# Patient Record
Sex: Male | Born: 1975 | Race: White | Hispanic: No | Marital: Single | Smoking: Current every day smoker
Health system: Southern US, Community
[De-identification: ages and names within clinical notes are randomized; demographics above are authoritative.]

## PROBLEM LIST (undated history)

## (undated) DIAGNOSIS — I10 Essential (primary) hypertension: Secondary | ICD-10-CM

## (undated) DIAGNOSIS — M1712 Unilateral primary osteoarthritis, left knee: Secondary | ICD-10-CM

## (undated) DIAGNOSIS — M1711 Unilateral primary osteoarthritis, right knee: Secondary | ICD-10-CM

## (undated) DIAGNOSIS — I499 Cardiac arrhythmia, unspecified: Secondary | ICD-10-CM

## (undated) DIAGNOSIS — R000 Tachycardia, unspecified: Secondary | ICD-10-CM

## (undated) HISTORY — PX: MULTIPLE TOOTH EXTRACTIONS: SHX2053

## (undated) HISTORY — PX: ANKLE SURGERY: SHX546

---

## 2016-02-08 ENCOUNTER — Emergency Department (HOSPITAL_COMMUNITY)
Admission: EM | Admit: 2016-02-08 | Discharge: 2016-02-08 | Disposition: A | Discharge status: Home / Self Care | Payer: BLUE CROSS/BLUE SHIELD | Attending: Emergency Medicine | Admitting: Emergency Medicine

## 2016-02-08 ENCOUNTER — Encounter (HOSPITAL_COMMUNITY): Payer: Self-pay | Admitting: *Deleted

## 2016-02-08 DIAGNOSIS — I10 Essential (primary) hypertension: Secondary | ICD-10-CM | POA: Diagnosis not present

## 2016-02-08 DIAGNOSIS — F17200 Nicotine dependence, unspecified, uncomplicated: Secondary | ICD-10-CM | POA: Insufficient documentation

## 2016-02-08 DIAGNOSIS — R000 Tachycardia, unspecified: Secondary | ICD-10-CM | POA: Diagnosis present

## 2016-02-08 DIAGNOSIS — R002 Palpitations: Secondary | ICD-10-CM | POA: Insufficient documentation

## 2016-02-08 HISTORY — DX: Tachycardia, unspecified: R00.0

## 2016-02-08 HISTORY — DX: Essential (primary) hypertension: I10

## 2016-02-08 LAB — CBC
HCT: 48.4 % (ref 39.0–52.0)
Hemoglobin: 16.4 g/dL (ref 13.0–17.0)
MCH: 30.3 pg (ref 26.0–34.0)
MCHC: 33.9 g/dL (ref 30.0–36.0)
MCV: 89.5 fL (ref 78.0–100.0)
PLATELETS: 310 10*3/uL (ref 150–400)
RBC: 5.41 MIL/uL (ref 4.22–5.81)
RDW: 13.4 % (ref 11.5–15.5)
WBC: 8.6 10*3/uL (ref 4.0–10.5)

## 2016-02-08 LAB — BASIC METABOLIC PANEL
Anion gap: 10 (ref 5–15)
BUN: 9 mg/dL (ref 6–20)
CALCIUM: 9.3 mg/dL (ref 8.9–10.3)
CO2: 22 mmol/L (ref 22–32)
CREATININE: 0.89 mg/dL (ref 0.61–1.24)
Chloride: 106 mmol/L (ref 101–111)
Glucose, Bld: 102 mg/dL — ABNORMAL HIGH (ref 65–99)
Potassium: 4.3 mmol/L (ref 3.5–5.1)
SODIUM: 138 mmol/L (ref 135–145)

## 2016-02-08 LAB — I-STAT CHEM 8, ED
BUN: 11 mg/dL (ref 6–20)
CREATININE: 0.9 mg/dL (ref 0.61–1.24)
Calcium, Ion: 1.14 mmol/L (ref 1.12–1.23)
Chloride: 106 mmol/L (ref 101–111)
Glucose, Bld: 102 mg/dL — ABNORMAL HIGH (ref 65–99)
HEMATOCRIT: 50 % (ref 39.0–52.0)
HEMOGLOBIN: 17 g/dL (ref 13.0–17.0)
POTASSIUM: 4.4 mmol/L (ref 3.5–5.1)
Sodium: 141 mmol/L (ref 135–145)
TCO2: 23 mmol/L (ref 0–100)

## 2016-02-08 LAB — I-STAT TROPONIN, ED: TROPONIN I, POC: 0.01 ng/mL (ref 0.00–0.08)

## 2016-02-08 MED ORDER — METOPROLOL TARTRATE 25 MG PO TABS
25.0000 mg | ORAL_TABLET | Freq: Once | ORAL | Status: AC
  Administered 2016-02-08: 25 mg via ORAL
  Filled 2016-02-08: qty 1

## 2016-02-08 MED ORDER — SODIUM CHLORIDE 0.9 % IV BOLUS (SEPSIS)
1000.0000 mL | Freq: Once | INTRAVENOUS | Status: AC
  Administered 2016-02-08: 1000 mL via INTRAVENOUS

## 2016-02-08 NOTE — Discharge Instructions (Signed)

## 2016-02-08 NOTE — ED Provider Notes (Signed)
CSN: [id number]     Arrival date & time 02/08/16  9562 History   First MD Initiated Contact with Patient 02/08/16 [phone]     Chief Complaint  Patient presents with  . Tachycardia    Patient is a 40 y.o. male presenting with palpitations. The history is provided by the patient.  Palpitations Palpitations quality:  Regular Onset quality:  Sudden Timing:  Constant Progression:  Improving Chronicity:  New Relieved by: htn meds. Worsened by:  Nothing Associated symptoms: diaphoresis, nausea, shortness of breath and vomiting   Associated symptoms: no chest pain and no syncope   Patient reports he woke up this morning he felt his heart racing He denies CP He reports he had some associated shortness of breath No syncope He took a metoprolol but reports nausea/vomiting soon after taking this medication  He reports he had similar episode previously and seen in hospitals in [place] area.  He is unsure of any final diagnosis but he is on metoprolol and losartan for HTN No h/o cardioversion.  He does not recall any adenosine administration  Past Medical History  Diagnosis Date  . Tachycardia   . Hypertension    Past Surgical History  Procedure Laterality Date  . Ankle surgery     No family history on file. Social History  Substance Use Topics  . Smoking status: Current Every Day Smoker  . Smokeless tobacco: Never Used  . Alcohol Use: Yes    Review of Systems  Constitutional: Positive for diaphoresis.  Respiratory: Positive for shortness of breath.   Cardiovascular: Positive for palpitations. Negative for chest pain and syncope.  Gastrointestinal: Positive for nausea and vomiting.  Neurological: Negative for syncope.  All other systems reviewed and are negative.     Allergies  Codeine  Home Medications   Prior to Admission medications   Not on File   BP 126/98 mmHg  Pulse 118  Temp(Src) 98.2 F (36.8 C) (Oral)  Resp 24  Ht  (1.88 m)  Wt 145.151 kg   BMI 41.07 kg/m2  SpO2 100% Physical Exam CONSTITUTIONAL: Well developed/well nourished, diaphoretic HEAD: Normocephalic/atraumatic EYES: EOMI/PERRL ENMT: Mucous membranes moist NECK: supple no meningeal signs SPINE/BACK:entire spine nontender CV: tachycardic, no murmurs/rubs/gallops noted LUNGS: Lungs are clear to auscultation bilaterally, no apparent distress ABDOMEN: soft, nontender NEURO: Pt is awake/alert/appropriate, moves all extremitiesx4.  No facial droop.   EXTREMITIES: pulses normal/equal, full ROM SKIN: warm, color normal PSYCH: no abnormalities of mood noted, alert and oriented to situation  ED Course  Procedures  7:14 AM Pt with onset of palpitations this morning, reportedly had heart rate in the 190s on arrival but when I Was in room it was 120s.  He may have had an episode of SVT that converted spontaneously He has no CP He is feeling improved Plan to hydrate, give dose of metoprolol and monitor in the ED Signed out to dr Cyndie Chime to f/u on labs and monitor rhythm  Labs Review Labs Reviewed  I-STAT CHEM 8, ED  Rosezena Sensor, ED      EKG Interpretation   Date/Time:  Thursday February 08 2016 06:45:50 EDT Ventricular Rate:  116 PR Interval:  165 QRS Duration: 95 QT Interval:  308 QTC Calculation: 428 R Axis:   88 Text Interpretation:  Sinus tachycardia Multiple premature complexes, vent  & supraven Low voltage, precordial leads No previous ECGs available  Abnormal ekg Confirmed by Bebe Shaggy  MD, Ladarrius Bogdanski ([id number]) on 02/08/2016  7:01:44 AM      MDM  Final diagnoses:  None    Nursing notes including past medical history and social history reviewed and considered in documentation     Zadie Rhineonald Tilly Pernice, MD 02/08/16 [phone]

## 2016-02-08 NOTE — ED Notes (Signed)
Patient presents stating he woke up with his heart racing.  Stated this has happened in the past.  Takes Lopressor and Lorsartin  States when he got here his heart was in the 190's

## 2016-02-08 NOTE — ED Provider Notes (Signed)
I accepted this patient in signout pending observation for 2 hours. After almost 3 hours of observation the patient did not have any recurrence of his symptoms or SVT. He appeared well. He had normal heart and lung examination during my evaluation. He was discharged home in stable condition per plan from prior physician.  Leta BaptistEmily Roe Nguyen, MD 02/08/16 [phone]

## 2016-02-13 ENCOUNTER — Ambulatory Visit (INDEPENDENT_AMBULATORY_CARE_PROVIDER_SITE_OTHER): Payer: BLUE CROSS/BLUE SHIELD | Admitting: Cardiovascular Disease

## 2016-02-13 ENCOUNTER — Encounter: Payer: Self-pay | Admitting: Cardiovascular Disease

## 2016-02-13 VITALS — BP 118/90 | HR 80 | Ht 74.0 in | Wt 337.2 lb

## 2016-02-13 DIAGNOSIS — R0600 Dyspnea, unspecified: Secondary | ICD-10-CM | POA: Diagnosis not present

## 2016-02-13 DIAGNOSIS — I10 Essential (primary) hypertension: Secondary | ICD-10-CM | POA: Diagnosis not present

## 2016-02-13 DIAGNOSIS — I471 Supraventricular tachycardia: Secondary | ICD-10-CM

## 2016-02-13 MED ORDER — METOPROLOL SUCCINATE ER 50 MG PO TB24: 50.0000 mg | ORAL_TABLET | Freq: Every day | ORAL | Status: AC

## 2016-02-13 NOTE — Patient Instructions (Signed)
Medication Instructions:  Your physician has recommended you make the following change in your medication:  1. INCREASE Metoprolol Succinate 50mg  take one tablet by mouth daily  Labwork: No new orders.   Testing/Procedures: Your physician has requested that you have an echocardiogram. Echocardiography is a painless test that uses sound waves to create images of your heart. It provides your doctor with information about the size and shape of your heart and how well your heart's chambers and valves are working. This procedure takes approximately one hour. There are no restrictions for this procedure.  Follow-Up: Your physician wants you to follow-up in: 6 MONTHS with Dr Kirke CorinArida.  You will receive a reminder letter in the mail two months in advance. If you don't receive a letter, please call our office to schedule the follow-up appointment.   Any Other Special Instructions Will Be Listed Below (If Applicable).     If you need a refill on your cardiac medications before your next appointment, please call your pharmacy.

## 2016-02-13 NOTE — Progress Notes (Signed)
Cardiology Office Note   Date:  02/13/2016   ID:  Tony Valdez, DOB May 08, 1976, MRN [id number]  PCP:  [facility]  Cardiologist:  New : Lorine Bears, MD   No chief complaint on file.     History of Present Illness: Tony Valdez is a 40 y.o. male who was referred from the emergency room for evaluation of tachycardia. He reports history of tachycardia that started in 2013. He was hospitalized in [place] at that time and according to him he underwent a stress test and echocardiogram. Both of them were unremarkable. He was discharged home on metoprolol extended release 25 mg once daily. He was on an losartan before that for essential hypertension. He had another episode of tachycardia 2016 after he stopped taking his medications. He went to the emergency room and his medications were refilled. He sometimes has short episodes of tachycardia that usually terminates with vagal maneuvers. He recently woke up at 4:00 in the morning and noted significant tachycardia that did not respond to bearing down. The episode lasted for a few hours and thus he went to the emergency room. By the time he arrived, he was noted to be in sinus tachycardia with a heart rate of around 120 bpm. EKG showed frequent PACs. His labs were overall unremarkable. He complains of chronic exertional dyspnea with no recent worsening. He denies any chest pain. He is not aware of history of sleep apnea. He sleeps by himself and is not aware of apnea episodes. He does snore but has no other symptoms suggestive of sleep apnea. Other medical problems include obesity and tobacco use. He smokes 2 packs per day since he was 40 years old. He does have family history of premature coronary artery disease as his father had myocardial infarction twice in his 56s. He works as a Geophysical data processor.    Past Medical History  Diagnosis Date  . Tachycardia   . Hypertension     Past Surgical History    Procedure Laterality Date  . Ankle surgery       Current Outpatient Prescriptions  Medication Sig Dispense Refill  . losartan (COZAAR) 100 MG tablet Take 100 mg by mouth daily.    . metoprolol succinate (TOPROL-XL) 25 MG 24 hr tablet Take 25 mg by mouth daily.     No current facility-administered medications for this visit.    Allergies:   Codeine    Social History:  The patient  reports that he has been smoking.  He has never used smokeless tobacco. He reports that he drinks alcohol. He reports that he does not use illicit drugs.   Family History:  The patient's family history includes Cancer in his mother; Heart disease in his father; Hypertension in his father.    ROS:  Please see the history of present illness.   Otherwise, review of systems are positive for none.   All other systems are reviewed and negative.    PHYSICAL EXAM: VS:  BP 118/90 mmHg  Pulse 80  Ht  (1.88 m)  Wt 337 lb 4 oz (152.976 kg)  BMI 43.28 kg/m2  SpO2 97% , BMI Body mass index is 43.28 kg/(m^2). GEN: Well nourished, well developed, in no acute distress HEENT: normal Neck: no JVD, carotid bruits, or masses Cardiac: RRR; no murmurs, rubs, or gallops,no edema  Respiratory:  clear to auscultation bilaterally, normal work of breathing GI: soft, nontender, nondistended, + BS MS: no deformity or atrophy Skin: warm and dry,  no rash Neuro:  Strength and sensation are intact Psych: euthymic mood, full affect   EKG:  EKG is not ordered today. Recent EKGs were reviewed and showed sinus tachycardia with PACs.   Recent Labs: 02/08/2016: BUN 11; Creatinine, Ser 0.90; Hemoglobin 17.0; Platelets 310; Potassium 4.4; Sodium 141    Lipid Panel No results found for: CHOL, TRIG, HDL, CHOLHDL, VLDL, LDLCALC, LDLDIRECT    Wt Readings from Last 3 Encounters:  02/13/16 337 lb 4 oz (152.976 kg)  02/08/16 320 lb (145.151 kg)        ASSESSMENT AND PLAN:  1.  Paroxysmal supraventricular tachycardia:  The patient's history is highly suggestive of this. His episodes are not very frequent and overall he had 4 episodes since 2013. He is aware of vagal maneuvers. I elected to increase the dose of Toprol to 50 mg once daily. I requested an echocardiogram given his associated shortness of breath showed no structural heart disease. If the episodes become more frequent, catheter ablation can be considered.  2. Essential hypertension: Blood pressure is reasonably controlled on medications.  3. Tobacco use: I discussed with him the importance of smoking cessation. He actually does have a prescription for Chantix but he has not started using.  4. Obesity: I discussed the importance of weight loss. He has no convincing symptoms of sleep apnea. However, given his hypertension and obesity, a sleep study should be considered.    Disposition:   FU with me in 6 months  Signed,  Lorine BearsMuhammad Arida, MD  02/13/2016 8:56 AM    [facility]

## 2016-02-29 ENCOUNTER — Ambulatory Visit (HOSPITAL_COMMUNITY): Payer: BLUE CROSS/BLUE SHIELD | Attending: Internal Medicine

## 2016-02-29 ENCOUNTER — Other Ambulatory Visit: Payer: Self-pay

## 2016-02-29 DIAGNOSIS — I471 Supraventricular tachycardia, unspecified: Secondary | ICD-10-CM

## 2016-02-29 DIAGNOSIS — I119 Hypertensive heart disease without heart failure: Secondary | ICD-10-CM | POA: Diagnosis not present

## 2016-02-29 DIAGNOSIS — R0600 Dyspnea, unspecified: Secondary | ICD-10-CM

## 2016-02-29 LAB — ECHOCARDIOGRAM COMPLETE
AOASC: 34 cm
CHL CUP DOP CALC LVOT VTI: 23 cm
E decel time: 194 msec
E/e' ratio: 9.19
FS: 29 % (ref 28–44)
IV/PV OW: 1
LA diam index: 1.66 cm/m2
LA vol index: 14.8 mL/m2
LASIZE: 45 mm
LAVOL: 40 mL
LAVOLA4C: 38 mL
LDCA: 5.31 cm2
LEFT ATRIUM END SYS DIAM: 45 mm
LV E/e'average: 9.19
LV TDI E'LATERAL: 8.77
LV TDI E'MEDIAL: 8.15
LVEEMED: 9.19
LVELAT: 8.77 cm/s
LVOT peak grad rest: 4 mmHg
LVOTD: 26 mm
LVOTPV: 99.4 cm/s
LVOTSV: 122 mL
MV Dec: 194
MV pk E vel: 80.6 m/s
MVPG: 3 mmHg
MVPKAVEL: 51.8 m/s
PW: 10.7 mm — AB (ref 0.6–1.1)

## 2016-02-29 MED ORDER — PERFLUTREN LIPID MICROSPHERE
1.0000 mL | INTRAVENOUS | Status: AC | PRN
  Administered 2016-02-29: 2 mL via INTRAVENOUS

## 2016-06-09 ENCOUNTER — Encounter (HOSPITAL_COMMUNITY): Payer: Self-pay

## 2016-06-09 ENCOUNTER — Emergency Department (HOSPITAL_COMMUNITY)
Admission: EM | Admit: 2016-06-09 | Discharge: 2016-06-09 | Disposition: A | Discharge status: Home / Self Care | Payer: BLUE CROSS/BLUE SHIELD | Attending: Emergency Medicine | Admitting: Emergency Medicine

## 2016-06-09 DIAGNOSIS — I10 Essential (primary) hypertension: Secondary | ICD-10-CM | POA: Insufficient documentation

## 2016-06-09 DIAGNOSIS — Y939 Activity, unspecified: Secondary | ICD-10-CM | POA: Insufficient documentation

## 2016-06-09 DIAGNOSIS — S161XXA Strain of muscle, fascia and tendon at neck level, initial encounter: Secondary | ICD-10-CM | POA: Insufficient documentation

## 2016-06-09 DIAGNOSIS — Y92410 Unspecified street and highway as the place of occurrence of the external cause: Secondary | ICD-10-CM | POA: Insufficient documentation

## 2016-06-09 DIAGNOSIS — F17200 Nicotine dependence, unspecified, uncomplicated: Secondary | ICD-10-CM | POA: Insufficient documentation

## 2016-06-09 DIAGNOSIS — Y999 Unspecified external cause status: Secondary | ICD-10-CM | POA: Diagnosis not present

## 2016-06-09 DIAGNOSIS — S199XXA Unspecified injury of neck, initial encounter: Secondary | ICD-10-CM | POA: Diagnosis present

## 2016-06-09 MED ORDER — NAPROXEN 500 MG PO TABS: 500.0000 mg | ORAL_TABLET | Freq: Two times a day (BID) | ORAL | 0 refills | Status: DC

## 2016-06-09 MED ORDER — DIAZEPAM 5 MG PO TABS: 5.0000 mg | ORAL_TABLET | Freq: Two times a day (BID) | ORAL | 0 refills | Status: DC

## 2016-06-09 NOTE — Discharge Instructions (Signed)
Take your medication as prescribed as an for pain relief. I recommend applying heat to affected area for 15 minutes 3-4 times daily for additional pain relief. Follow up with your primary care provider in the next week if her symptoms have not improved. Please return to the Emergency Department if symptoms worsen or new onset of fever, headache, neck stiffness, back pain, chest pain, difficulty breathing, abdominal pain, numbness, tailing, weakness.
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
## 2016-06-09 NOTE — ED Notes (Signed)
Declined W/C at D/C and was escorted to lobby by RN. 
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
## 2016-11-05 ENCOUNTER — Ambulatory Visit (INDEPENDENT_AMBULATORY_CARE_PROVIDER_SITE_OTHER): Payer: BLUE CROSS/BLUE SHIELD | Admitting: Cardiovascular Disease

## 2016-11-05 ENCOUNTER — Encounter: Payer: Self-pay | Admitting: Cardiovascular Disease

## 2016-11-05 VITALS — BP 118/78 | HR 77 | Ht 74.0 in | Wt 298.0 lb

## 2016-11-05 DIAGNOSIS — I471 Supraventricular tachycardia: Secondary | ICD-10-CM

## 2016-11-05 DIAGNOSIS — I10 Essential (primary) hypertension: Secondary | ICD-10-CM

## 2016-11-05 DIAGNOSIS — Z720 Tobacco use: Secondary | ICD-10-CM | POA: Diagnosis not present

## 2016-11-05 NOTE — Patient Instructions (Signed)

## 2016-11-05 NOTE — Progress Notes (Signed)
Cardiology Office Note   Date:  11/05/2016   ID:  Tony Valdez, DOB 08-07-1976, MRN [id number]  PCP:  [facility]  Cardiologist:   Lorine Bears, MD   Chief Complaint  Patient presents with  . Follow-up      History of Present Illness: Tony Valdez is a 41 y.o. male who is here today for a follow-up visit regarding paroxysmal supraventricular tachycardia.  He had 4 episodes of tachycardia since 2013 and none in the last year. These were suspected to be supraventricular tachycardia. He had episodes in the past that responded to vagal maneuvers. Other medical problems include obesity, essential hypertension and tobacco use.  He works as a Geophysical data processor. Echocardiogram in July 2017 showed normal LV systolic function with no significant valvular abnormalities. He has been doing well with no recurrent palpitations or tachycardia. No chest pain or shortness of breath. He lost 38 pounds over the last year.   Past Medical History:  Diagnosis Date  . Hypertension   . Tachycardia     Past Surgical History:  Procedure Laterality Date  . ANKLE SURGERY       Current Outpatient Prescriptions  Medication Sig Dispense Refill  . losartan (COZAAR) 100 MG tablet Take 100 mg by mouth daily.    . metoprolol succinate (TOPROL-XL) 50 MG 24 hr tablet Take 1 tablet (50 mg total) by mouth daily. 90 tablet 3   No current facility-administered medications for this visit.     Allergies:   Codeine    Social History:  The patient  reports that he has been smoking.  He has been smoking about 1.00 pack per day. He has never used smokeless tobacco. He reports that he drinks alcohol. He reports that he does not use drugs.   Family History:  The patient's family history includes Cancer in his mother; Heart disease in his father; Hypertension in his father.    ROS:  Please see the history of present illness.   Otherwise, review of systems are positive for  none.   All other systems are reviewed and negative.    PHYSICAL EXAM: VS:  BP 118/78   Pulse 77   Ht 6\' 2"  (1.88 m)   Wt 298 lb (135.2 kg)   BMI 38.26 kg/m  , BMI Body mass index is 38.26 kg/m. GEN: Well nourished, well developed, in no acute distress  HEENT: normal  Neck: no JVD, carotid bruits, or masses Cardiac: RRR; no murmurs, rubs, or gallops,no edema  Respiratory:  clear to auscultation bilaterally, normal work of breathing GI: soft, nontender, nondistended, + BS MS: no deformity or atrophy  Skin: warm and dry, no rash Neuro:  Strength and sensation are intact Psych: euthymic mood, full affect   EKG:  EKG is ordered today. Normal sinus rhythm with significant ST or T wave changes.   Recent Labs: 02/08/2016: BUN 11; Creatinine, Ser 0.90; Hemoglobin 17.0; Platelets 310; Potassium 4.4; Sodium 141    Lipid Panel No results found for: CHOL, TRIG, HDL, CHOLHDL, VLDL, LDLCALC, LDLDIRECT    Wt Readings from Last 3 Encounters:  11/05/16 298 lb (135.2 kg)  06/09/16 (!) 315 lb (142.9 kg)  02/13/16 (!) 337 lb 4 oz (153 kg)        ASSESSMENT AND PLAN:  1.  Paroxysmal supraventricular tachycardia:  No tachycardic episodes over the last year. Continue treatment with Toprol.  2. Essential hypertension: Blood pressure is well controlled on losartan and Toprol. If blood pressure goes  down with weight loss, the dose of losartan can be decreased.  3. Tobacco use:  I again discussed with him the importance of smoking cessation but he has not been able to quit.  4. Obesity: I congratulated him on weight loss. He is more active than before and trying to pay more attention to his diet.  Disposition:   FU with me in 12 months  Signed,  Lorine BearsMuhammad Arida, MD  11/05/2016 10:25 AM    [facility]

## 2017-12-04 ENCOUNTER — Telehealth: Payer: Self-pay | Admitting: *Deleted

## 2017-12-04 NOTE — Telephone Encounter (Signed)
Needs appt, has not been seen for 1 year.     Primary Cardiologist:Muhammad Kirke CorinArida, MD  Chart reviewed as part of pre-operative protocol coverage. Because of Tony Valdez's past medical history and time since last visit, he/she will require a follow-up visit in order to better assess preoperative cardiovascular risk.  Pre-op covering staff: - Please schedule appointment and call patient to inform them. - Please contact requesting surgeon's office via preferred method (i.e, phone, fax) to inform them of need for appointment prior to surgery.  Nada BoozerLaura Ingold, NP  12/04/2017, 4:06 PM

## 2017-12-04 NOTE — Telephone Encounter (Signed)
   [facility] Pre-operative Risk Assessment    Request for surgical clearance:  1. What type of surgery is being performed? Right total Knee Replacement   2. When is this surgery scheduled? 12/30/17   3. What type of clearance is required (medical clearance vs. Pharmacy clearance to hold med vs. Both)? medical  4. Are there any medications that need to be held prior to surgery and how long?   5. Practice name and name of physician performing surgery? [facility] Dr. Mardelle Matte   6. What is your office phone number [phone]    7.   What is your office fax number [phone] attn: Sherri  8.   Anesthesia type (None, local, MAC, general) ?    Tony Valdez 12/04/2017, 11:45 AM  _________________________________________________________________   (provider comments below)

## 2017-12-04 NOTE — Telephone Encounter (Signed)
Pt notified that he needs to be seen prior to his cardiac clearance. Pt has been scheduled to see Azalee CourseHao Meng, PA-C 12/18/17 @ 11:00. Soonest thing before pts surgery. Pt thanked me for the call.

## 2017-12-04 NOTE — Telephone Encounter (Signed)
-----   Message from Leone BrandLaura R Ingold, NP sent at 12/04/2017  4:09 PM EDT ----- Please arrange appt.  Prior to 12/30/17.

## 2017-12-17 ENCOUNTER — Other Ambulatory Visit: Payer: Self-pay | Admitting: Orthopedic Surgery

## 2017-12-18 ENCOUNTER — Ambulatory Visit (INDEPENDENT_AMBULATORY_CARE_PROVIDER_SITE_OTHER): Payer: BLUE CROSS/BLUE SHIELD | Admitting: Physician Assistant

## 2017-12-18 ENCOUNTER — Encounter: Payer: Self-pay | Admitting: Physician Assistant

## 2017-12-18 VITALS — BP 138/92 | HR 56 | Ht 74.0 in | Wt 322.8 lb

## 2017-12-18 DIAGNOSIS — I10 Essential (primary) hypertension: Secondary | ICD-10-CM | POA: Diagnosis not present

## 2017-12-18 DIAGNOSIS — I471 Supraventricular tachycardia: Secondary | ICD-10-CM | POA: Diagnosis not present

## 2017-12-18 DIAGNOSIS — Z01810 Encounter for preprocedural cardiovascular examination: Secondary | ICD-10-CM | POA: Diagnosis not present

## 2017-12-18 NOTE — Patient Instructions (Signed)
Medication Instructions:  Continue current medications.  If you need a refill on your cardiac medications before your next appointment, please call your pharmacy.  Labwork: None ordered  Testing/Procedures: None Ordered  Follow-Up: Your physician wants you to follow-up in: One year with Dr.Arida. You should receive a reminder letter in the mail two months in advance. If you do not receive a letter, please call our office [phone].   Thank you for choosing [facility] at [facility]!!

## 2017-12-18 NOTE — Pre-Procedure Instructions (Signed)
Juliette MangleDamon Fickling  12/18/2017      [name] - [place], [place] - [address] [place] [id number] Phone: [phone] Fax: [phone]    Your procedure is scheduled on May 7  [facility] Admitting at 0900 A.M.  Call this number if you have problems the morning of surgery:  [phone]   Remember:  Do not eat food or drink liquids after midnight.  Continue all medications as directed by your physician except follow these medication instructions before surgery below   Take these medicines the morning of surgery with A SIP OF WATER  buPROPion (WELLBUTRIN SR) metoprolol succinate (TOPROL-XL)  7 days prior to surgery STOP taking any Aspirin(unless otherwise instructed by your surgeon), Aleve, Naproxen, Ibuprofen, Motrin, Advil, Goody's, BC's, all herbal medications, fish oil, and all vitamins    Do not wear jewelry  Do not wear lotions, powders, or cologne, or deodorant.  Men may shave face and neck.  Do not bring valuables to the hospital.  [facility] is not responsible for any belongings or valuables.  Contacts, dentures or bridgework may not be worn into surgery.  Leave your suitcase in the car.  After surgery it may be brought to your room.  For patients admitted to the hospital, discharge time will be determined by your treatment team.  Patients discharged the day of surgery will not be allowed to drive home.    Special instructions:   [facility]- Preparing For Surgery  Before surgery, you can play an important role. Because skin is not sterile, your skin needs to be as free of germs as possible. You can reduce the number of germs on your skin by washing with CHG (chlorahexidine gluconate) Soap before surgery.  CHG is an antiseptic cleaner which kills germs and bonds with the skin to continue killing germs even after washing.  Please do not use if you have an allergy to CHG or antibacterial soaps. If your skin  becomes reddened/irritated stop using the CHG.  Do not shave (including legs and underarms) for at least 48 hours prior to first CHG shower. It is OK to shave your face.  Please follow these instructions carefully.   1. Shower the NIGHT BEFORE SURGERY and the MORNING OF SURGERY with CHG.   2. If you chose to wash your hair, wash your hair first as usual with your normal shampoo.  3. After you shampoo, rinse your hair and body thoroughly to remove the shampoo.  4. Use CHG as you would any other liquid soap. You can apply CHG directly to the skin and wash gently with a scrungie or a clean washcloth.   5. Apply the CHG Soap to your body ONLY FROM THE NECK DOWN.  Do not use on open wounds or open sores. Avoid contact with your eyes, ears, mouth and genitals (private parts). Wash Face and genitals (private parts)  with your normal soap.  6. Wash thoroughly, paying special attention to the area where your surgery will be performed.  7. Thoroughly rinse your body with warm water from the neck down.  8. DO NOT shower/wash with your normal soap after using and rinsing off the CHG Soap.  9. Pat yourself dry with a CLEAN TOWEL.  10. Wear CLEAN PAJAMAS to bed the night before surgery, wear comfortable clothes the morning of surgery  11. Place CLEAN SHEETS on your bed the night of your first shower and DO NOT  SLEEP WITH PETS.    Day of Surgery: Do not apply any deodorants/lotions. Please wear clean clothes to the hospital/surgery center.      Please read over the following fact sheets that you were given.

## 2017-12-18 NOTE — Progress Notes (Signed)
Cardiology Office Note    Date:  12/20/2017   ID:  Tony Valdez, DOB 29-Mar-1976, MRN [id number]  PCP:  [facility]  Cardiologist:  Dr. Kirke Corin  Chief Complaint  Patient presents with  . Follow-up    pt denies chest pain, SOB, swelling in hands or feet  . Pre-op Exam    surg clearance, requested by Dr. Teryl Lucy prior to knee surgery    History of Present Illness:  Tony Valdez is a 42 y.o. male with past medical history of paroxysmal supraventricular tachycardia and hypertension.  He had 4 episodes of tachycardia since 2013.  Her last office visit with Dr. Kirke Corin was in March 2018.  He was doing well at the time. His previous episodes have responded to vagal maneuvers.  Otherwise he does not have any prior history of coronary artery disease.  Last echocardiogram obtained on 02/29/2016 showed EF 55 to 60%, grade 1 DD, no significant valvular issue.  Patient presents today for annual visit and preoperative clearance prior to bilateral knee surgery by Dr. Teryl Lucy.  He will have his right knee done in May and potentially left knee done July.  He denies any recent chest pain, shortness of breath, or dizziness.  He has been tolerating the current blood pressure medication okay.  He does not check his blood pressure at home.  He has no problems climbing up 2 flight of stairs or walk 4 blocks away from his house and back.  Since he is able to complete at least 4 METs of activity, he is cleared to proceed with surgery.  In the past year he has not had any palpitation.  If in the post surgical recovery he has any supraventricular tachycardia, he can take extra dose of metoprolol for suppression.   Past Medical History:  Diagnosis Date  . Hypertension   . Tachycardia     Past Surgical History:  Procedure Laterality Date  . ANKLE SURGERY Left   . MULTIPLE TOOTH EXTRACTIONS      Current Medications: Outpatient Medications Prior to Visit  Medication Sig  Dispense Refill  . buPROPion (WELLBUTRIN SR) 150 MG 12 hr tablet Take 300 mg by mouth daily.    Marland Kitchen ibuprofen (ADVIL,MOTRIN) 200 MG tablet Take 400-600 mg by mouth daily as needed for headache or moderate pain.    Marland Kitchen losartan (COZAAR) 100 MG tablet Take 100 mg by mouth daily.    . metoprolol succinate (TOPROL-XL) 50 MG 24 hr tablet Take 1 tablet (50 mg total) by mouth daily. 90 tablet 3  . nicotine (NICODERM CQ - DOSED IN MG/24 HOURS) 21 mg/24hr patch Place 21 mg onto the skin daily.     No facility-administered medications prior to visit.      Allergies:   Codeine   Social History   Socioeconomic History  . Marital status: Single    Spouse name: Not on file  . Number of children: Not on file  . Years of education: Not on file  . Highest education level: Not on file  Occupational History  . Not on file  Social Needs  . Financial resource strain: Not on file  . Food insecurity:    Worry: Not on file    Inability: Not on file  . Transportation needs:    Medical: Not on file    Non-medical: Not on file  Tobacco Use  . Smoking status: Current Every Day Smoker    Packs/day: 1.50  . Smokeless tobacco: Never  Used  . Tobacco comment: work to quit currently  Substance and Sexual Activity  . Alcohol use: Yes    Comment: occ  . Drug use: No  . Sexual activity: Not on file  Lifestyle  . Physical activity:    Days per week: Not on file    Minutes per session: Not on file  . Stress: Not on file  Relationships  . Social connections:    Talks on phone: Not on file    Gets together: Not on file    Attends religious service: Not on file    Active member of club or organization: Not on file    Attends meetings of clubs or organizations: Not on file    Relationship status: Not on file  Other Topics Concern  . Not on file  Social History Narrative  . Not on file     Family History:  The patient's family history includes Cancer in his mother; Heart disease in his father;  Hypertension in his father.   ROS:   Please see the history of present illness.    ROS All other systems reviewed and are negative.   PHYSICAL EXAM:   VS:  BP (!) 138/92 (BP Location: Left Arm)   Pulse (!) 56   Ht 6\' 2"  (1.88 m)   Wt (!) 322 lb 12.8 oz (146.4 kg)   BMI 41.45 kg/m    GEN: Well nourished, well developed, in no acute distress  HEENT: normal  Neck: no JVD, carotid bruits, or masses Cardiac: RRR; no murmurs, rubs, or gallops,no edema  Respiratory:  clear to auscultation bilaterally, normal work of breathing GI: soft, nontender, nondistended, + BS MS: no deformity or atrophy  Skin: warm and dry, no rash Neuro:  Alert and Oriented x 3, Strength and sensation are intact Psych: euthymic mood, full affect  Wt Readings from Last 3 Encounters:  12/19/17 (!) 323 lb 11.2 oz (146.8 kg)  12/18/17 (!) 322 lb 12.8 oz (146.4 kg)  11/05/16 298 lb (135.2 kg)      Studies/Labs Reviewed:   EKG:  EKG is ordered today.  The ekg ordered today demonstrates sinus bradycardia, heart rate 56, otherwise no significant ST-T wave changes  Recent Labs: 12/19/2017: BUN 12; Creatinine, Ser 0.84; Hemoglobin 16.1; Platelets 275; Potassium 4.3; Sodium 138   Lipid Panel No results found for: CHOL, TRIG, HDL, CHOLHDL, VLDL, LDLCALC, LDLDIRECT  Additional studies/ records that were reviewed today include:   Echo 02/29/2016 LV EF: 55% -   60% Study Conclusions  - Procedure narrative: Transthoracic echocardiography for left   ventricular function evaluation, for right ventricular function   evaluation, and for assessment of valvular function. Image   quality was suboptimal. Intravenous contrast (Definity) was   administered to enhance regional wall motion assessment and   opacify the LV. - Left ventricle: The cavity size was normal. Wall thickness was   normal. Systolic function was normal. The estimated ejection   fraction was in the range of 55% to 60%. Wall motion was normal;   there  were no regional wall motion abnormalities. Doppler   parameters are consistent with abnormal left ventricular   relaxation (grade 1 diastolic dysfunction). The E/e&' ratio is   between 8-15, suggesting indeterminate LV filling pressure. - Left atrium: The atrium was normal in size.  Impressions:  - Technically difficult study. Definity contrast given. LVEF   55-60%, normal wall thickness, grossly normal wall motion,   diastolic dysfunction, indeterminate LV filling pressure, normal  LA size.    ASSESSMENT:    1. Preop cardiovascular exam   2. PSVT (paroxysmal supraventricular tachycardia) (HCC)   3. Essential hypertension      PLAN:  In order of problems listed above:  1. Preoperative clearance: Upcoming knee surgery by Dr. Teryl Lucy.  Patient is able to complete at least 4 METS of activity without any exertional chest pain or shortness of breath.  He does not have any prior history of CAD.  He may proceed with surgery without further work-up.    2. PSVT: Well-controlled on metoprolol, he has not had any recurrence in the past year.  He may take additional metoprolol on an as-needed basis if he does have recurrence.  3. Hypertension: Blood pressure mildly elevated, will need to monitor at home, if continue to be elevated on follow-up, will uptitrate blood pressure medication.    Medication Adjustments/Labs and Tests Ordered: Current medicines are reviewed at length with the patient today.  Concerns regarding medicines are outlined above.  Medication changes, Labs and Tests ordered today are listed in the Patient Instructions below. Patient Instructions  Medication Instructions:  Continue current medications.  If you need a refill on your cardiac medications before your next appointment, please call your pharmacy.  Labwork: None ordered  Testing/Procedures: None Ordered  Follow-Up: Your physician wants you to follow-up in: One year with Dr.Arida. You should  receive a reminder letter in the mail two months in advance. If you do not receive a letter, please call our office [phone].   Thank you for choosing [facility] at [employer], [place], [place]  12/20/2017 8:35 PM    [facility] [address] [place], [place], [place]  [address] Phone: [phone]; Fax: [phone]

## 2017-12-19 ENCOUNTER — Encounter (HOSPITAL_COMMUNITY)
Admission: RE | Admit: 2017-12-19 | Discharge: 2017-12-19 | Disposition: A | Discharge status: Home / Self Care | Payer: BLUE CROSS/BLUE SHIELD | Source: Ambulatory Visit | Attending: Orthopedic Surgery | Admitting: Orthopedic Surgery

## 2017-12-19 ENCOUNTER — Encounter (HOSPITAL_COMMUNITY): Payer: Self-pay

## 2017-12-19 ENCOUNTER — Other Ambulatory Visit: Payer: Self-pay

## 2017-12-19 DIAGNOSIS — Z01812 Encounter for preprocedural laboratory examination: Secondary | ICD-10-CM | POA: Diagnosis not present

## 2017-12-19 LAB — BASIC METABOLIC PANEL
Anion gap: 10 (ref 5–15)
BUN: 12 mg/dL (ref 6–20)
CALCIUM: 9.3 mg/dL (ref 8.9–10.3)
CO2: 21 mmol/L — AB (ref 22–32)
CREATININE: 0.84 mg/dL (ref 0.61–1.24)
Chloride: 107 mmol/L (ref 101–111)
GFR calc Af Amer: >60 mL/min (ref >60)
GFR calc non Af Amer: >60 mL/min (ref >60)
GLUCOSE: 93 mg/dL (ref 65–99)
Potassium: 4.3 mmol/L (ref 3.5–5.1)
Sodium: 138 mmol/L (ref 135–145)

## 2017-12-19 LAB — CBC
HCT: 47.6 % (ref 39.0–52.0)
HEMOGLOBIN: 16.1 g/dL (ref 13.0–17.0)
MCH: 29.4 pg (ref 26.0–34.0)
MCHC: 33.8 g/dL (ref 30.0–36.0)
MCV: 87 fL (ref 78.0–100.0)
PLATELETS: 275 10*3/uL (ref 150–400)
RBC: 5.47 MIL/uL (ref 4.22–5.81)
RDW: 13.4 % (ref 11.5–15.5)
WBC: 7.6 10*3/uL (ref 4.0–10.5)

## 2017-12-19 LAB — SURGICAL PCR SCREEN
MRSA, PCR: NEGATIVE
STAPHYLOCOCCUS AUREUS: NEGATIVE

## 2017-12-19 NOTE — Progress Notes (Signed)
PCP - [facility] in [place]  Cardiologist - Arida  Chest x-ray - not needed EKG - 12/18/17 - requesting from arida's office Stress Test - 2013 ECHO - 2013 Cardiac Cath - denies  Anesthesia review: Yes  Patient denies shortness of breath, fever, cough and chest pain at PAT appointment   Patient verbalized understanding of instructions that were given to them at the PAT appointment. Patient was also instructed that they will need to review over the PAT instructions again at home before surgery.

## 2017-12-20 ENCOUNTER — Encounter: Payer: Self-pay | Admitting: Physician Assistant

## 2017-12-25 ENCOUNTER — Telehealth: Payer: Self-pay | Admitting: Cardiovascular Disease

## 2017-12-25 NOTE — Telephone Encounter (Signed)
° °  [facility] Pre-operative Risk Assessment    Request for surgical clearance:  1. What type of surgery is being performed? R Total Knee Replacement   2. When is this surgery scheduled? 12/30/17  3. What type of clearance is required (medical clearance vs. Pharmacy clearance to hold med vs. Both)?  both  4. Are there any medications that need to be held prior to surgery and how long? Not noted on form  5. Practice name and name of physician performing surgery? [facility]   6. What is your office phone number [phone] ext [phone]   7.   What is your office fax number [phone]   8.   Anesthesia type (None, local, MAC, general) ? No noted on form    [name] 12/25/2017, 9:57 AM  _________________________________________________________________   (provider comments below)

## 2017-12-25 NOTE — Telephone Encounter (Signed)
   Primary Cardiologist: Lorine Bears, MD  Chart reviewed as part of pre-operative protocol coverage. Given past medical history and time since last visit, based on ACC/AHA guidelines, Zephyr Meixner would be at acceptable risk for the planned procedure without further cardiovascular testing.   I will route this recommendation to the requesting party via Epic fax function and remove from pre-op pool.  Please call with questions.  Eula Listen, PA-C 12/25/2017, 2:56 PM

## 2017-12-29 MED ORDER — CEFAZOLIN SODIUM 10 G IJ SOLR
3.0000 g | INTRAMUSCULAR | Status: AC
  Administered 2017-12-30: 3 g via INTRAVENOUS
  Filled 2017-12-29: qty 3

## 2017-12-30 ENCOUNTER — Ambulatory Visit (HOSPITAL_COMMUNITY): Payer: BLUE CROSS/BLUE SHIELD | Admitting: Anesthesiology

## 2017-12-30 ENCOUNTER — Encounter (HOSPITAL_COMMUNITY)
Admission: RE | Disposition: A | Discharge status: Home / Self Care | Payer: Self-pay | Source: Ambulatory Visit | Attending: Orthopedic Surgery

## 2017-12-30 ENCOUNTER — Observation Stay (HOSPITAL_COMMUNITY)
Admission: RE | Admit: 2017-12-30 | Discharge: 2017-12-31 | Disposition: A | Discharge status: Home / Self Care | Payer: BLUE CROSS/BLUE SHIELD | Source: Ambulatory Visit | Attending: Orthopedic Surgery | Admitting: Orthopedic Surgery

## 2017-12-30 ENCOUNTER — Encounter (HOSPITAL_COMMUNITY): Payer: Self-pay | Admitting: *Deleted

## 2017-12-30 ENCOUNTER — Observation Stay (HOSPITAL_COMMUNITY): Payer: BLUE CROSS/BLUE SHIELD

## 2017-12-30 DIAGNOSIS — Z79899 Other long term (current) drug therapy: Secondary | ICD-10-CM | POA: Diagnosis not present

## 2017-12-30 DIAGNOSIS — Z8052 Family history of malignant neoplasm of bladder: Secondary | ICD-10-CM | POA: Insufficient documentation

## 2017-12-30 DIAGNOSIS — Z885 Allergy status to narcotic agent status: Secondary | ICD-10-CM | POA: Diagnosis not present

## 2017-12-30 DIAGNOSIS — M1711 Unilateral primary osteoarthritis, right knee: Principal | ICD-10-CM | POA: Diagnosis present

## 2017-12-30 DIAGNOSIS — Z8249 Family history of ischemic heart disease and other diseases of the circulatory system: Secondary | ICD-10-CM | POA: Diagnosis not present

## 2017-12-30 DIAGNOSIS — I10 Essential (primary) hypertension: Secondary | ICD-10-CM | POA: Insufficient documentation

## 2017-12-30 DIAGNOSIS — F17200 Nicotine dependence, unspecified, uncomplicated: Secondary | ICD-10-CM | POA: Diagnosis not present

## 2017-12-30 DIAGNOSIS — R000 Tachycardia, unspecified: Secondary | ICD-10-CM | POA: Insufficient documentation

## 2017-12-30 DIAGNOSIS — Z96651 Presence of right artificial knee joint: Secondary | ICD-10-CM

## 2017-12-30 HISTORY — DX: Morbid (severe) obesity due to excess calories: E66.01

## 2017-12-30 HISTORY — PX: PARTIAL KNEE ARTHROPLASTY: SHX2174

## 2017-12-30 HISTORY — DX: Unilateral primary osteoarthritis, right knee: M17.11

## 2017-12-30 SURGERY — ARTHROPLASTY, KNEE, UNICOMPARTMENTAL
Anesthesia: General | Site: Knee | Laterality: Right

## 2017-12-30 MED ORDER — ONDANSETRON HCL 4 MG/2ML IJ SOLN
INTRAMUSCULAR | Status: DC | PRN
  Administered 2017-12-30: 4 mg via INTRAVENOUS

## 2017-12-30 MED ORDER — OXYCODONE HCL 5 MG PO TABS
5.0000 mg | ORAL_TABLET | ORAL | Status: DC | PRN
  Administered 2017-12-30 – 2017-12-31 (×3): 10 mg via ORAL
  Filled 2017-12-30 (×2): qty 2

## 2017-12-30 MED ORDER — BUPIVACAINE HCL (PF) 0.25 % IJ SOLN
INTRAMUSCULAR | Status: DC | PRN
  Administered 2017-12-30: 30 mL

## 2017-12-30 MED ORDER — KETOROLAC TROMETHAMINE 30 MG/ML IJ SOLN
INTRAMUSCULAR | Status: DC | PRN
  Administered 2017-12-30: 30 mg via INTRAVENOUS

## 2017-12-30 MED ORDER — ACETAMINOPHEN 325 MG PO TABS: 325.0000 mg | ORAL_TABLET | ORAL | Status: DC | PRN

## 2017-12-30 MED ORDER — RIVAROXABAN 10 MG PO TABS: 10.0000 mg | ORAL_TABLET | Freq: Every day | ORAL | 0 refills | Status: DC

## 2017-12-30 MED ORDER — POLYETHYLENE GLYCOL 3350 17 G PO PACK: 17.0000 g | PACK | Freq: Every day | ORAL | Status: DC | PRN

## 2017-12-30 MED ORDER — BACLOFEN 10 MG PO TABS: 10.0000 mg | ORAL_TABLET | Freq: Three times a day (TID) | ORAL | 0 refills | Status: DC

## 2017-12-30 MED ORDER — ACETAMINOPHEN 325 MG PO TABS: 325.0000 mg | ORAL_TABLET | Freq: Four times a day (QID) | ORAL | Status: DC | PRN

## 2017-12-30 MED ORDER — MIDAZOLAM HCL 2 MG/2ML IJ SOLN
INTRAMUSCULAR | Status: DC | PRN
  Administered 2017-12-30: 1 mg via INTRAVENOUS

## 2017-12-30 MED ORDER — NICOTINE 21 MG/24HR TD PT24
21.0000 mg | MEDICATED_PATCH | Freq: Every day | TRANSDERMAL | Status: DC
  Administered 2017-12-31: 21 mg via TRANSDERMAL
  Filled 2017-12-30: qty 1

## 2017-12-30 MED ORDER — BUPIVACAINE HCL (PF) 0.25 % IJ SOLN
INTRAMUSCULAR | Status: AC
  Filled 2017-12-30: qty 30

## 2017-12-30 MED ORDER — ONDANSETRON HCL 4 MG PO TABS: 4.0000 mg | ORAL_TABLET | Freq: Three times a day (TID) | ORAL | 0 refills | Status: DC | PRN

## 2017-12-30 MED ORDER — DEXAMETHASONE SODIUM PHOSPHATE 10 MG/ML IJ SOLN
INTRAMUSCULAR | Status: AC
  Filled 2017-12-30: qty 1

## 2017-12-30 MED ORDER — METHOCARBAMOL 1000 MG/10ML IJ SOLN
500.0000 mg | Freq: Four times a day (QID) | INTRAVENOUS | Status: DC | PRN
  Filled 2017-12-30: qty 5

## 2017-12-30 MED ORDER — BUPIVACAINE IN DEXTROSE 0.75-8.25 % IT SOLN
INTRATHECAL | Status: DC | PRN
  Administered 2017-12-30: 2 mL via INTRATHECAL

## 2017-12-30 MED ORDER — METHOCARBAMOL 500 MG PO TABS
ORAL_TABLET | ORAL | Status: AC
  Filled 2017-12-30: qty 1

## 2017-12-30 MED ORDER — PROPOFOL 500 MG/50ML IV EMUL
INTRAVENOUS | Status: DC | PRN
  Administered 2017-12-30: 50 ug/kg/min via INTRAVENOUS
  Administered 2017-12-30: 11:00:00 via INTRAVENOUS

## 2017-12-30 MED ORDER — PHENYLEPHRINE HCL 10 MG/ML IJ SOLN
INTRAMUSCULAR | Status: DC | PRN
  Administered 2017-12-30: 25 ug/min via INTRAVENOUS

## 2017-12-30 MED ORDER — POTASSIUM CHLORIDE IN NACL 20-0.45 MEQ/L-% IV SOLN
INTRAVENOUS | Status: DC
  Filled 2017-12-30 (×2): qty 1000

## 2017-12-30 MED ORDER — METOPROLOL SUCCINATE ER 50 MG PO TB24
50.0000 mg | ORAL_TABLET | Freq: Every day | ORAL | Status: DC
  Administered 2017-12-31: 50 mg via ORAL
  Filled 2017-12-30: qty 1

## 2017-12-30 MED ORDER — KETOROLAC TROMETHAMINE 15 MG/ML IJ SOLN
15.0000 mg | Freq: Four times a day (QID) | INTRAMUSCULAR | Status: AC
  Administered 2017-12-30 – 2017-12-31 (×4): 15 mg via INTRAVENOUS
  Filled 2017-12-30 (×4): qty 1

## 2017-12-30 MED ORDER — SENNA-DOCUSATE SODIUM 8.6-50 MG PO TABS: 2.0000 | ORAL_TABLET | Freq: Every day | ORAL | 1 refills | Status: AC

## 2017-12-30 MED ORDER — FENTANYL CITRATE (PF) 250 MCG/5ML IJ SOLN
INTRAMUSCULAR | Status: DC | PRN
  Administered 2017-12-30: 50 ug via INTRAVENOUS

## 2017-12-30 MED ORDER — METOCLOPRAMIDE HCL 5 MG PO TABS: 5.0000 mg | ORAL_TABLET | Freq: Three times a day (TID) | ORAL | Status: DC | PRN

## 2017-12-30 MED ORDER — PROPOFOL 10 MG/ML IV BOLUS
INTRAVENOUS | Status: AC
  Filled 2017-12-30: qty 20

## 2017-12-30 MED ORDER — MIDAZOLAM HCL 2 MG/2ML IJ SOLN
INTRAMUSCULAR | Status: AC
  Administered 2017-12-30: 2 mg
  Filled 2017-12-30: qty 2

## 2017-12-30 MED ORDER — CEFAZOLIN SODIUM-DEXTROSE 2-4 GM/100ML-% IV SOLN
2.0000 g | Freq: Four times a day (QID) | INTRAVENOUS | Status: AC
  Administered 2017-12-30 – 2017-12-31 (×2): 2 g via INTRAVENOUS
  Filled 2017-12-30 (×2): qty 100

## 2017-12-30 MED ORDER — OXYCODONE HCL 5 MG PO TABS: 5.0000 mg | ORAL_TABLET | Freq: Once | ORAL | Status: DC | PRN

## 2017-12-30 MED ORDER — PHENOL 1.4 % MT LIQD: 1.0000 | OROMUCOSAL | Status: DC | PRN

## 2017-12-30 MED ORDER — MEPERIDINE HCL 50 MG/ML IJ SOLN: 6.2500 mg | INTRAMUSCULAR | Status: DC | PRN

## 2017-12-30 MED ORDER — HYDROMORPHONE HCL 1 MG/ML IJ SOLN: 0.5000 mg | INTRAMUSCULAR | Status: DC | PRN

## 2017-12-30 MED ORDER — OXYCODONE HCL 5 MG PO TABS
ORAL_TABLET | ORAL | Status: AC
  Filled 2017-12-30: qty 2

## 2017-12-30 MED ORDER — SODIUM CHLORIDE 0.9 % IR SOLN
Status: DC | PRN
  Administered 2017-12-30: 1000 mL

## 2017-12-30 MED ORDER — ROPIVACAINE HCL 7.5 MG/ML IJ SOLN
INTRAMUSCULAR | Status: DC | PRN
  Administered 2017-12-30: 30 mL via PERINEURAL

## 2017-12-30 MED ORDER — FENTANYL CITRATE (PF) 250 MCG/5ML IJ SOLN
INTRAMUSCULAR | Status: AC
  Filled 2017-12-30: qty 5

## 2017-12-30 MED ORDER — PHENYLEPHRINE 40 MCG/ML (10ML) SYRINGE FOR IV PUSH (FOR BLOOD PRESSURE SUPPORT)
PREFILLED_SYRINGE | INTRAVENOUS | Status: AC
  Filled 2017-12-30: qty 10

## 2017-12-30 MED ORDER — ONDANSETRON HCL 4 MG PO TABS: 4.0000 mg | ORAL_TABLET | Freq: Four times a day (QID) | ORAL | Status: DC | PRN

## 2017-12-30 MED ORDER — BUPROPION HCL ER (SR) 150 MG PO TB12
300.0000 mg | ORAL_TABLET | Freq: Every day | ORAL | Status: DC
  Administered 2017-12-31: 300 mg via ORAL
  Filled 2017-12-30: qty 2

## 2017-12-30 MED ORDER — DEXAMETHASONE SODIUM PHOSPHATE 10 MG/ML IJ SOLN
10.0000 mg | Freq: Once | INTRAMUSCULAR | Status: AC
  Administered 2017-12-31: 10 mg via INTRAVENOUS
  Filled 2017-12-30: qty 1

## 2017-12-30 MED ORDER — FENTANYL CITRATE (PF) 100 MCG/2ML IJ SOLN: 25.0000 ug | INTRAMUSCULAR | Status: DC | PRN

## 2017-12-30 MED ORDER — METHOCARBAMOL 500 MG PO TABS
500.0000 mg | ORAL_TABLET | Freq: Four times a day (QID) | ORAL | Status: DC | PRN
  Administered 2017-12-30 – 2017-12-31 (×3): 500 mg via ORAL
  Filled 2017-12-30 (×2): qty 1

## 2017-12-30 MED ORDER — HYDROMORPHONE HCL 2 MG/ML IJ SOLN
0.5000 mg | INTRAMUSCULAR | Status: DC | PRN
  Administered 2017-12-31 (×2): 1 mg via INTRAVENOUS
  Filled 2017-12-30 (×2): qty 1

## 2017-12-30 MED ORDER — DIPHENHYDRAMINE HCL 12.5 MG/5ML PO ELIX: 12.5000 mg | ORAL_SOLUTION | ORAL | Status: DC | PRN

## 2017-12-30 MED ORDER — MENTHOL 3 MG MT LOZG: 1.0000 | LOZENGE | OROMUCOSAL | Status: DC | PRN

## 2017-12-30 MED ORDER — LIDOCAINE 2% (20 MG/ML) 5 ML SYRINGE
INTRAMUSCULAR | Status: AC
  Filled 2017-12-30: qty 5

## 2017-12-30 MED ORDER — FENTANYL CITRATE (PF) 100 MCG/2ML IJ SOLN
INTRAMUSCULAR | Status: AC
  Administered 2017-12-30: 100 ug
  Filled 2017-12-30: qty 2

## 2017-12-30 MED ORDER — PHENYLEPHRINE 40 MCG/ML (10ML) SYRINGE FOR IV PUSH (FOR BLOOD PRESSURE SUPPORT)
PREFILLED_SYRINGE | INTRAVENOUS | Status: DC | PRN
  Administered 2017-12-30 (×2): 80 ug via INTRAVENOUS

## 2017-12-30 MED ORDER — ACETAMINOPHEN 500 MG PO TABS
1000.0000 mg | ORAL_TABLET | Freq: Four times a day (QID) | ORAL | Status: AC
  Administered 2017-12-30 – 2017-12-31 (×4): 1000 mg via ORAL
  Filled 2017-12-30 (×4): qty 2

## 2017-12-30 MED ORDER — ONDANSETRON HCL 4 MG/2ML IJ SOLN: 4.0000 mg | Freq: Once | INTRAMUSCULAR | Status: DC | PRN

## 2017-12-30 MED ORDER — METOCLOPRAMIDE HCL 5 MG/ML IJ SOLN: 5.0000 mg | Freq: Three times a day (TID) | INTRAMUSCULAR | Status: DC | PRN

## 2017-12-30 MED ORDER — MAGNESIUM CITRATE PO SOLN: 1.0000 | Freq: Once | ORAL | Status: DC | PRN

## 2017-12-30 MED ORDER — OXYCODONE HCL 5 MG/5ML PO SOLN: 5.0000 mg | Freq: Once | ORAL | Status: DC | PRN

## 2017-12-30 MED ORDER — ZOLPIDEM TARTRATE 5 MG PO TABS: 5.0000 mg | ORAL_TABLET | Freq: Every evening | ORAL | Status: DC | PRN

## 2017-12-30 MED ORDER — ALUM & MAG HYDROXIDE-SIMETH 200-200-20 MG/5ML PO SUSP: 30.0000 mL | ORAL | Status: DC | PRN

## 2017-12-30 MED ORDER — OXYCODONE HCL 5 MG PO TABS: 5.0000 mg | ORAL_TABLET | ORAL | 0 refills | Status: DC | PRN

## 2017-12-30 MED ORDER — RIVAROXABAN 10 MG PO TABS
10.0000 mg | ORAL_TABLET | Freq: Every day | ORAL | Status: DC
  Administered 2017-12-31: 10 mg via ORAL
  Filled 2017-12-30: qty 1

## 2017-12-30 MED ORDER — LOSARTAN POTASSIUM 50 MG PO TABS
100.0000 mg | ORAL_TABLET | Freq: Every day | ORAL | Status: DC
  Administered 2017-12-30 – 2017-12-31 (×2): 100 mg via ORAL
  Filled 2017-12-30 (×2): qty 2

## 2017-12-30 MED ORDER — LACTATED RINGERS IV SOLN
INTRAVENOUS | Status: DC
  Administered 2017-12-30 (×2): via INTRAVENOUS

## 2017-12-30 MED ORDER — KETOROLAC TROMETHAMINE 30 MG/ML IJ SOLN
INTRAMUSCULAR | Status: AC
  Filled 2017-12-30: qty 1

## 2017-12-30 MED ORDER — BISACODYL 10 MG RE SUPP: 10.0000 mg | Freq: Every day | RECTAL | Status: DC | PRN

## 2017-12-30 MED ORDER — MIDAZOLAM HCL 2 MG/2ML IJ SOLN
INTRAMUSCULAR | Status: AC
  Filled 2017-12-30: qty 2

## 2017-12-30 MED ORDER — OXYCODONE HCL 5 MG PO TABS
10.0000 mg | ORAL_TABLET | ORAL | Status: DC | PRN
  Administered 2017-12-30 – 2017-12-31 (×3): 10 mg via ORAL
  Filled 2017-12-30 (×3): qty 2

## 2017-12-30 MED ORDER — ONDANSETRON HCL 4 MG/2ML IJ SOLN: 4.0000 mg | Freq: Four times a day (QID) | INTRAMUSCULAR | Status: DC | PRN

## 2017-12-30 MED ORDER — CHLORHEXIDINE GLUCONATE 4 % EX LIQD: 60.0000 mL | Freq: Once | CUTANEOUS | Status: DC

## 2017-12-30 MED ORDER — ONDANSETRON HCL 4 MG/2ML IJ SOLN
INTRAMUSCULAR | Status: AC
  Filled 2017-12-30: qty 4

## 2017-12-30 MED ORDER — ACETAMINOPHEN 160 MG/5ML PO SOLN: 325.0000 mg | ORAL | Status: DC | PRN

## 2017-12-30 MED ORDER — DOCUSATE SODIUM 100 MG PO CAPS
100.0000 mg | ORAL_CAPSULE | Freq: Two times a day (BID) | ORAL | Status: DC
  Administered 2017-12-30 – 2017-12-31 (×2): 100 mg via ORAL
  Filled 2017-12-30: qty 1

## 2017-12-30 MED ORDER — 0.9 % SODIUM CHLORIDE (POUR BTL) OPTIME
TOPICAL | Status: DC | PRN
  Administered 2017-12-30: 1000 mL

## 2017-12-30 SURGICAL SUPPLY — 55 items
ARCOM OXFORD UNICOMPART ×3 IMPLANT
BANDAGE ACE 6X5 VEL STRL LF (GAUZE/BANDAGES/DRESSINGS) ×3 IMPLANT
BANDAGE ESMARK 6X9 LF (GAUZE/BANDAGES/DRESSINGS) ×1 IMPLANT
BNDG ELASTIC 6X15 VLCR STRL LF (GAUZE/BANDAGES/DRESSINGS) ×3 IMPLANT
BNDG ESMARK 6X9 LF (GAUZE/BANDAGES/DRESSINGS) ×3
BOWL SMART MIX CTS (DISPOSABLE) ×3 IMPLANT
CEMENT BONE R 1X40 (Cement) ×3 IMPLANT
CLOSURE STERI-STRIP 1/2X4 (GAUZE/BANDAGES/DRESSINGS) ×2
CLOSURE WOUND 1/2 X4 (GAUZE/BANDAGES/DRESSINGS) ×1
CLSR STERI-STRIP ANTIMIC 1/2X4 (GAUZE/BANDAGES/DRESSINGS) ×4 IMPLANT
COVER SURGICAL LIGHT HANDLE (MISCELLANEOUS) ×3 IMPLANT
CUFF TOURNIQUET SINGLE 44IN (TOURNIQUET CUFF) ×3 IMPLANT
DRAPE EXTREMITY T 121X128X90 (DRAPE) IMPLANT
DRAPE HALF SHEET 40X57 (DRAPES) ×3 IMPLANT
DRAPE U-SHAPE 47X51 STRL (DRAPES) ×3 IMPLANT
DRSG MEPILEX BORDER 4X8 (GAUZE/BANDAGES/DRESSINGS) ×6 IMPLANT
DURAPREP 26ML APPLICATOR (WOUND CARE) ×3 IMPLANT
ELECT CAUTERY BLADE 6.4 (BLADE) ×3 IMPLANT
ELECT REM PT RETURN 9FT ADLT (ELECTROSURGICAL) ×3
ELECTRODE REM PT RTRN 9FT ADLT (ELECTROSURGICAL) ×1 IMPLANT
GLOVE BIOGEL PI ORTHO PRO SZ8 (GLOVE) ×4
GLOVE ORTHO TXT STRL SZ7.5 (GLOVE) ×3 IMPLANT
GLOVE PI ORTHO PRO STRL SZ8 (GLOVE) ×2 IMPLANT
GLOVE SURG ORTHO 8.0 STRL STRW (GLOVE) ×3 IMPLANT
GOWN STRL REUS W/ TWL XL LVL3 (GOWN DISPOSABLE) ×1 IMPLANT
GOWN STRL REUS W/TWL 2XL LVL3 (GOWN DISPOSABLE) ×3 IMPLANT
GOWN STRL REUS W/TWL XL LVL3 (GOWN DISPOSABLE) ×2
HANDPIECE INTERPULSE COAX TIP (DISPOSABLE) ×2
HOOD PEEL AWAY FACE SHEILD DIS (HOOD) ×3 IMPLANT
HOOD PEEL AWAY FLYTE STAYCOOL (MISCELLANEOUS) ×3 IMPLANT
IMMOBILIZER KNEE 22 UNIV (SOFTGOODS) ×3 IMPLANT
KIT BASIN OR (CUSTOM PROCEDURE TRAY) ×3 IMPLANT
KIT TURNOVER KIT B (KITS) ×3 IMPLANT
KNEE OXFORD ARCOM UNICOMPART ×1 IMPLANT
MANIFOLD NEPTUNE II (INSTRUMENTS) ×3 IMPLANT
NEEDLE HYPO 21X1.5 SAFETY (NEEDLE) ×3 IMPLANT
NS IRRIG 1000ML POUR BTL (IV SOLUTION) ×3 IMPLANT
PACK BLADE SAW RECIP 70 3 PT (BLADE) ×3 IMPLANT
PACK TOTAL JOINT (CUSTOM PROCEDURE TRAY) ×3 IMPLANT
PAD ARMBOARD 7.5X6 YLW CONV (MISCELLANEOUS) ×6 IMPLANT
PEG FEMORAL CEMENT STRL LRG (Knees) ×3 IMPLANT
SET HNDPC FAN SPRY TIP SCT (DISPOSABLE) ×1 IMPLANT
STRIP CLOSURE SKIN 1/2X4 (GAUZE/BANDAGES/DRESSINGS) ×2 IMPLANT
SUCTION FRAZIER HANDLE 10FR (MISCELLANEOUS) ×2
SUCTION TUBE FRAZIER 10FR DISP (MISCELLANEOUS) ×1 IMPLANT
SUT VIC AB 0 CT1 27 (SUTURE) ×2
SUT VIC AB 0 CT1 27XBRD ANBCTR (SUTURE) ×1 IMPLANT
SUT VIC AB 1 CT1 27 (SUTURE) ×2
SUT VIC AB 1 CT1 27XBRD ANBCTR (SUTURE) ×1 IMPLANT
SUT VIC AB 2-0 CT1 27 (SUTURE) ×2
SUT VIC AB 2-0 CT1 TAPERPNT 27 (SUTURE) ×1 IMPLANT
SUT VIC AB 3-0 SH 8-18 (SUTURE) ×3 IMPLANT
SYR CONTROL 10ML LL (SYRINGE) ×3 IMPLANT
TOWEL OR 17X26 10 PK STRL BLUE (TOWEL DISPOSABLE) ×3 IMPLANT
TRAY TIBIAL STERILE SZ E RT (Joint) ×3 IMPLANT

## 2017-12-30 NOTE — H&P (Signed)
PREOPERATIVE H&P  Chief Complaint: Right knee pain  HPI: Tony Valdez is a 42 y.o. male who presents for preoperative history and physical with a diagnosis of right knee primary localized osteoarthritis. Symptoms are rated as moderate to severe, and have been worsening.  This is significantly impairing activities of daily living.  He has elected for surgical management.   He has failed injections, activity modification, anti-inflammatories, and assistive devices.  Preoperative X-rays demonstrate end stage degenerative changes with osteophyte formation, loss of joint space, subchondral sclerosis. He is failed Visco supplementation as well.  Past Medical History:  Diagnosis Date  . Hypertension   . Tachycardia    Past Surgical History:  Procedure Laterality Date  . ANKLE SURGERY Left   . MULTIPLE TOOTH EXTRACTIONS     Social History   Socioeconomic History  . Marital status: Single    Spouse name: Not on file  . Number of children: Not on file  . Years of education: Not on file  . Highest education level: Not on file  Occupational History  . Not on file  Social Needs  . Financial resource strain: Not on file  . Food insecurity:    Worry: Not on file    Inability: Not on file  . Transportation needs:    Medical: Not on file    Non-medical: Not on file  Tobacco Use  . Smoking status: Current Every Day Smoker    Packs/day: 1.50  . Smokeless tobacco: Never Used  . Tobacco comment: work to quit currently  Substance and Sexual Activity  . Alcohol use: Yes    Comment: occ  . Drug use: No  . Sexual activity: Not on file  Lifestyle  . Physical activity:    Days per week: Not on file    Minutes per session: Not on file  . Stress: Not on file  Relationships  . Social connections:    Talks on phone: Not on file    Gets together: Not on file    Attends religious service: Not on file    Active member of club or organization: Not on file    Attends meetings of clubs or  organizations: Not on file    Relationship status: Not on file  Other Topics Concern  . Not on file  Social History Narrative  . Not on file   Family History  Problem Relation Age of Onset  . Cancer Mother        bladder  . Hypertension Father   . Heart disease Father    Allergies  Allergen Reactions  . Codeine Nausea And Vomiting    GI UPSET   Prior to Admission medications   Medication Sig Start Date End Date Taking? Authorizing Provider  buPROPion (WELLBUTRIN SR) 150 MG 12 hr tablet Take 300 mg by mouth daily.   Yes [provider]  ibuprofen (ADVIL,MOTRIN) 200 MG tablet Take 400-600 mg by mouth daily as needed for headache or moderate pain.   Yes [provider]  losartan (COZAAR) 100 MG tablet Take 100 mg by mouth daily.   Yes [provider]  metoprolol succinate (TOPROL-XL) 50 MG 24 hr tablet Take 1 tablet (50 mg total) by mouth daily. 02/13/16  Yes Iran Ouch, MD  nicotine (NICODERM CQ - DOSED IN MG/24 HOURS) 21 mg/24hr patch Place 21 mg onto the skin daily.   Yes [provider]     Positive ROS: All other systems have been reviewed and were otherwise negative  with the exception of those mentioned in the HPI and as above.  Physical Exam: General: Alert, no acute distress Cardiovascular: No pedal edema Respiratory: No cyanosis, no use of accessory musculature GI: No organomegaly, abdomen is soft and non-tender Skin: No lesions in the area of chief complaint Neurologic: Sensation intact distally Psychiatric: Patient is competent for consent with normal mood and affect Lymphatic: No axillary or cervical lymphadenopathy  MUSCULOSKELETAL: Right knee has varus alignment with range of motion 5 degrees to 110 degrees with positive medial joint line tenderness and moderate effusion.  Assessment: Right anteromedial knee osteoarthritis   Plan: Plan for Procedure(s): UNICOMPARTMENTAL KNEE  The risks benefits and alternatives  were discussed with the patient including but not limited to the risks of nonoperative treatment, versus surgical intervention including infection, bleeding, nerve injury,  blood clots, cardiopulmonary complications, morbidity, mortality, among others, and they were willing to proceed.    Patient's anticipated LOS is less than 2 midnights, meeting these requirements: - Younger than 64 - Lives within 1 hour of care - Has a competent adult at home to recover with post-op recover - NO history of  - Chronic pain requiring opiods  - Diabetes  - Coronary Artery Disease  - Heart failure  - Heart attack  - Stroke  - DVT/VTE  - Cardiac arrhythmia  - Respiratory Failure/COPD  - Renal failure  - Anemia  - Advanced Liver disease       Preoperative templating of the joint replacement has been completed, documented, and submitted to the Operating Room personnel in order to optimize intra-operative equipment management.  Eulas Post, MD Cell [phone]   12/30/2017 9:45 AM

## 2017-12-30 NOTE — Anesthesia Procedure Notes (Signed)
Anesthesia Regional Block: Adductor canal block   Pre-Anesthetic Checklist: ,, timeout performed, Correct Patient, Correct Site, Correct Laterality, Correct Procedure, Correct Position, site marked, Risks and benefits discussed,  Surgical consent,  Pre-op evaluation,  At surgeon's request and post-op pain management  Laterality: Right  Prep: chloraprep       Needles:  Injection technique: Single-shot  Needle Type: Echogenic Stimulator Needle     Needle Length: 5cm  Needle Gauge: 22     Additional Needles:   Procedures:, nerve stimulator,,, ultrasound used (permanent image in chart),,,,  Narrative:  Start time: 12/30/2017 9:30 AM End time: 12/30/2017 9:35 AM Injection made incrementally with aspirations every 5 mL.  Performed by: Personally  Anesthesiologist: Bethena Midget, MD  Additional Notes: Functioning IV was confirmed and monitors were applied.  A 50mm 22ga Arrow echogenic stimulator needle was used. Sterile prep and drape,hand hygiene and sterile gloves were used. Ultrasound guidance: relevant anatomy identified, needle position confirmed, local anesthetic spread visualized around nerve(s)., vascular puncture avoided.  Image printed for medical record. Negative aspiration and negative test dose prior to incremental administration of local anesthetic. The patient tolerated the procedure well.

## 2017-12-30 NOTE — Evaluation (Signed)
Physical Therapy Evaluation Patient Details Name: Tony Valdez MRN: [id number] DOB: 1976-08-05 Today's Date: 12/30/2017   History of Present Illness  Pt is a 42 y/o male s/p R unicompartmental knee replacement. PMH includes obesity, HTN, and L ankle surgery.   Clinical Impression  Pt is s/p surgery above with deficits below. Gait limited to within the room secondary to pain. REquired min to min guard A for mobility using RW. Reviewed knee precautions and supine HEP. Will continue to follow acutely to maximize functional mobility independence and safety.     Follow Up Recommendations Follow surgeon's recommendation for DC plan and follow-up therapies;Supervision for mobility/OOB    Equipment Recommendations  Rolling walker with 5" wheels;3in1 (PT)(bariatric RW and bariatric 3 in 1)    Recommendations for Other Services       Precautions / Restrictions Precautions Precautions: Knee Precaution Booklet Issued: Yes (comment) Precaution Comments: Reviewed knee precautions and supine HEP.  Restrictions Weight Bearing Restrictions: Yes RLE Weight Bearing: Weight bearing as tolerated      Mobility  Bed Mobility Overal bed mobility: Needs Assistance Bed Mobility: Supine to Sit     Supine to sit: Supervision     General bed mobility comments: Supervision for safety.   Transfers Overall transfer level: Needs assistance Equipment used: Rolling walker (2 wheeled) Transfers: Sit to/from Stand Sit to Stand: Min assist         General transfer comment: Min A for lift assist and steadying. Verbal cues for safe hand placement.   Ambulation/Gait Ambulation/Gait assistance: Min guard Ambulation Distance (Feet): 10 Feet Assistive device: Rolling walker (2 wheeled) Gait Pattern/deviations: Step-to pattern;Decreased step length - right;Decreased step length - left;Decreased weight shift to right;Antalgic Gait velocity: Decreased  Gait velocity interpretation: <1.31 ft/sec, indicative  of household ambulator General Gait Details: Slow, antalgic gait. Distance limited to within the room secondary to pain. Verbal cues for sequencing using RW.   Stairs            Wheelchair Mobility    Modified Rankin (Stroke Patients Only)       Balance Overall balance assessment: Needs assistance Sitting-balance support: No upper extremity supported;Feet supported Sitting balance-Leahy Scale: Good     Standing balance support: Bilateral upper extremity supported;During functional activity Standing balance-Leahy Scale: Poor Standing balance comment: Reliant on BUE support                              Pertinent Vitals/Pain Pain Assessment: 0-10 Pain Score: 6  Pain Location: R knee  Pain Descriptors / Indicators: Aching;Operative site guarding Pain Intervention(s): Limited activity within patient's tolerance;Monitored during session;Repositioned    Home Living Family/patient expects to be discharged to:: Private residence Living Arrangements: Alone Available Help at Discharge: Family;Available 24 hours/day Type of Home: House Home Access: Ramped entrance     Home Layout: Two level;Able to live on main level with bedroom/bathroom Home Equipment: None      Prior Function Level of Independence: Independent               Hand Dominance   Dominant Hand: Right    Extremity/Trunk Assessment   Upper Extremity Assessment Upper Extremity Assessment: Overall WFL for tasks assessed    Lower Extremity Assessment Lower Extremity Assessment: RLE deficits/detail RLE Deficits / Details: Sensory in tact. Deficits consistent with post op pain and weakness. Able to perform ther ex below.     Cervical / Trunk Assessment Cervical / Trunk Assessment: Normal  Communication   Communication: No difficulties  Cognition Arousal/Alertness: Awake/alert Behavior During Therapy: WFL for tasks assessed/performed Overall Cognitive Status: Within Functional Limits  for tasks assessed                                        General Comments      Exercises Total Joint Exercises Ankle Circles/Pumps: AROM;Both;20 reps Heel Slides: AAROM;Right;10 reps   Assessment/Plan    PT Assessment Patient needs continued PT services  PT Problem List Decreased strength;Decreased range of motion;Decreased balance;Decreased activity tolerance;Decreased mobility;Decreased knowledge of use of DME;Decreased knowledge of precautions;Pain       PT Treatment Interventions DME instruction;Gait training;Functional mobility training;Therapeutic activities;Therapeutic exercise;Balance training;Patient/family education    PT Goals (Current goals can be found in the Care Plan section)  Acute Rehab PT Goals Patient Stated Goal: to decrease pain  PT Goal Formulation: With patient Time For Goal Achievement: 01/13/18 Potential to Achieve Goals: Good    Frequency 7X/week   Barriers to discharge        Co-evaluation               AM-PAC PT "6 Clicks" Daily Activity  Outcome Measure Difficulty turning over in bed (including adjusting bedclothes, sheets and blankets)?: A Little Difficulty moving from lying on back to sitting on the side of the bed? : A Little Difficulty sitting down on and standing up from a chair with arms (e.g., wheelchair, bedside commode, etc,.)?: Unable Help needed moving to and from a bed to chair (including a wheelchair)?: A Little Help needed walking in hospital room?: A Little Help needed climbing 3-5 steps with a railing? : A Lot 6 Click Score: 15    End of Session Equipment Utilized During Treatment: Gait belt Activity Tolerance: Patient limited by pain Patient left: in chair;with call bell/phone within reach;with nursing/sitter in room Nurse Communication: Mobility status PT Visit Diagnosis: Other abnormalities of gait and mobility (R26.89);Pain Pain - Right/Left: Right Pain - part of body: Knee    Time:  1750-1815 PT Time Calculation (min) (ACUTE ONLY): 25 min   Charges:   PT Evaluation $PT Eval Low Complexity: 1 Low PT Treatments $Therapeutic Activity: 8-22 mins   PT G Codes:        Gladys Damme, PT, DPT  Acute Rehabilitation Services  Pager: [phone]   Lehman Prom 12/30/2017, 6:31 PM

## 2017-12-30 NOTE — Transfer of Care (Signed)
Immediate Anesthesia Transfer of Care Note  Patient: Tony Valdez  Procedure(s) Performed: UNICOMPARTMENTAL KNEE (Right Knee)  Patient Location: PACU  Anesthesia Type:MAC, Regional and Spinal  Level of Consciousness: awake, alert  and oriented  Airway & Oxygen Therapy: Patient Spontanous Breathing  Post-op Assessment: Report given to RN and Post -op Vital signs reviewed and stable  Post vital signs: Reviewed and stable  Last Vitals:  Vitals Value Taken Time  BP 105/77 12/30/2017  1:22 PM  Temp    Pulse 64 12/30/2017  1:24 PM  Resp 15 12/30/2017  1:24 PM  SpO2 97 % 12/30/2017  1:24 PM  Vitals shown include unvalidated device data.  Last Pain:  Vitals:   12/30/17 0847  TempSrc:   PainSc: 0-No pain         Complications: No apparent anesthesia complications

## 2017-12-30 NOTE — Anesthesia Procedure Notes (Signed)
Procedure Name: MAC Performed by: Clearnce Sorrel, CRNA Pre-anesthesia Checklist: Patient identified, Emergency Drugs available, Suction available, Patient being monitored and Timeout performed Patient Re-evaluated:Patient Re-evaluated prior to induction Oxygen Delivery Method: Nasal cannula Placement Confirmation: positive ETCO2 Dental Injury: Teeth and Oropharynx as per pre-operative assessment

## 2017-12-30 NOTE — Anesthesia Procedure Notes (Signed)
Spinal  Patient location during procedure: OR Start time: 12/30/2017 10:50 AM End time: 12/30/2017 10:47 AM Staffing Anesthesiologist: Bethena Midget, MD Preanesthetic Checklist Completed: patient identified, site marked, surgical consent, pre-op evaluation, timeout performed, IV checked, risks and benefits discussed and monitors and equipment checked Spinal Block Patient position: sitting Prep: DuraPrep Patient monitoring: heart rate, cardiac monitor, continuous pulse ox and blood pressure Approach: midline Location: L4-5 Injection technique: single-shot Needle Needle type: Sprotte  Needle gauge: 24 G Needle length: 9 cm Assessment Sensory level: T4

## 2017-12-30 NOTE — Op Note (Signed)
12/30/2017  12:48 PM  PATIENT:  Tony Valdez    PRE-OPERATIVE DIAGNOSIS: Right knee primary localized osteoarthritis  POST-OPERATIVE DIAGNOSIS:  Same Morbid obesity with large stature Estimated body mass index is 41.47 kg/m as calculated from the following:   Height as of this encounter:  (1.88 m).   Weight as of this encounter: 146.5 kg (323 lb).   PROCEDURE: Right unicompartmental Knee Arthroplasty  SURGEON:  Eulas Post, MD  PHYSICIAN ASSISTANT: Janace Litten, OPA-C, present and scrubbed throughout the case, critical for completion in a timely fashion, and for retraction, instrumentation, and closure.  ANESTHESIA:   Spinal with intraoperative block  ESTIMATED BLOOD LOSS: 100 mL  UNIQUE ASPECTS OF THE CASE: He had substantial hypertrophic osteophyte on the medial side.  This also extended posteriorly.  I worked for a long time to get all of the posterior osteophytes out.  The patellofemoral joint was in good condition, the lateral compartment had some grade 2 changes, but these are fairly contained and fairly small, less than half a centimeter.  The medial side had grade 4 uncontained chondral loss on the femur with extensive chondral loss on the tibia.  His tibia was slightly undersized medial to lateral, but filled up anterior to posterior.  He was between a 3 and a 4, but ultimately a 4 provided good stability, particularly after all the osteophytes had been removed.  PREOPERATIVE INDICATIONS:  Tony Valdez is a  42 y.o. male with a diagnosis of djd right knee who failed conservative measures and elected for surgical management.    The risks benefits and alternatives were discussed with the patient preoperatively including but not limited to the risks of infection, bleeding, nerve injury, cardiopulmonary complications, blood clots, the need for revision surgery, among others, and the patient was willing to proceed.  OPERATIVE IMPLANTS: [employer] mobile bearing medial  compartment arthroplasty femur size large, tibia size E, bearing size 4.  OPERATIVE FINDINGS: Endstage grade 4 medial compartment osteoarthritis.  His ACL was intact, his lateral compartment had some grade 2 changes as indicated above.  The patellofemoral joint was pristine.  OPERATIVE PROCEDURE: The patient was brought to the operating room placed in the supine position.  Spinal anesthesia was administered. IV antibiotics were given. The lower extremity was placed in the legholder and prepped and draped in usual sterile fashion.  Time out was performed.  The leg was elevated and exsanguinated and the tourniquet was inflated. Anteromedial incision was performed, and I took care to preserve the MCL. Parapatellar incision was carried out, and the osteophytes were excised, along with the medial meniscus and a small portion of the fat pad.  The extra medullary tibial cutting jig was applied, using the spoon and the 4mm G-Clamp and the 2 mm shim, and I took care to protect the anterior cruciate ligament insertion and the tibial spine. The medial collateral ligament was also protected, and I resected my proximal tibia, matching the anatomic slope.   The proximal tibial bony cut was removed in one piece, and I turned my attention to the femur.  The intramedullary femoral rod was placed using the drill, and then using the appropriate reference, I assembled the femoral jig, setting my posterior cutting block. I resected my posterior femur, used the 0 spigot for the anterior femur, and then measured my gap.  I also took this time to remove posterior osteophytes using an osteotome and a curette.  I then used the appropriate mill to match the extension gap  to the flexion gap. The second milling was at a 3.  The gaps were then measured again with the appropriate feeler gauges. Once I had balanced flexion and extension gaps, I then completed the preparation of the femur.  I milled off the anterior aspect of the  distal femur to prevent impingement. I also exposed the tibia, and selected the above-named component, and then used the cutting jig to prepare the keel slot on the tibia. I also used the awl to curette out the bone to complete the preparation of the keel. The back wall was intact.  I then placed trial components, and it was found to have excellent motion, and appropriate balance.  I then cemented the components into place, cementing the tibia first, removing all excess cement, and then cementing the femur.  All loose cement was removed.  The real polyethylene insert was applied manually, and the knee was taken through functional range of motion, and found to have excellent stability and restoration of joint motion, with excellent balance.  The wounds were irrigated copiously, and the parapatellar tissue closed with Vicryl, followed by Vicryl for the subcutaneous tissue, with routine closure with Steri-Strips and sterile gauze.  The tourniquet was released, and the patient was awakened and extubated and returned to PACU in stable and satisfactory condition. There were no complications.

## 2017-12-30 NOTE — Anesthesia Preprocedure Evaluation (Addendum)
Anesthesia Evaluation  Patient identified by MRN, date of birth, ID band Patient awake    Reviewed: Allergy & Precautions, H&P , NPO status , Patient's Chart, lab work & pertinent test results, reviewed documented beta blocker date and time   Airway Mallampati: II  TM Distance: >3 FB Neck ROM: full    Dental no notable dental hx.    Pulmonary neg pulmonary ROS, Current Smoker,    Pulmonary exam normal breath sounds clear to auscultation       Cardiovascular Exercise Tolerance: Good hypertension, Pt. on medications  Rhythm:regular Rate:Normal  ECHO 7/16  Systolic function was normal. The estimated ejection   fraction was in the range of 55% to 60%.    Neuro/Psych negative neurological ROS  negative psych ROS   GI/Hepatic negative GI ROS, Neg liver ROS,   Endo/Other  negative endocrine ROS  Renal/GU negative Renal ROS  negative genitourinary   Musculoskeletal   Abdominal   Peds  Hematology negative hematology ROS (+)   Anesthesia Other Findings   Reproductive/Obstetrics negative OB ROS                             Anesthesia Physical Anesthesia Plan  ASA: II  Anesthesia Plan: General   Post-op Pain Management:  Regional for Post-op pain   Induction:   PONV Risk Score and Plan: 2 and Treatment may vary due to age or medical condition  Airway Management Planned: Oral ETT and LMA  Additional Equipment:   Intra-op Plan:   Post-operative Plan: Extubation in OR  Informed Consent: I have reviewed the patients History and Physical, chart, labs and discussed the procedure including the risks, benefits and alternatives for the proposed anesthesia with the patient or authorized representative who has indicated his/her understanding and acceptance.   Dental Advisory Given  Plan Discussed with: CRNA, Anesthesiologist and Surgeon  Anesthesia Plan Comments: (  )       Anesthesia  Quick Evaluation

## 2017-12-31 ENCOUNTER — Encounter (HOSPITAL_COMMUNITY): Payer: Self-pay | Admitting: Orthopedic Surgery

## 2017-12-31 DIAGNOSIS — M1711 Unilateral primary osteoarthritis, right knee: Secondary | ICD-10-CM | POA: Diagnosis not present

## 2017-12-31 LAB — BASIC METABOLIC PANEL
Anion gap: 6 (ref 5–15)
BUN: 17 mg/dL (ref 6–20)
CALCIUM: 8.5 mg/dL — AB (ref 8.9–10.3)
CO2: 27 mmol/L (ref 22–32)
Chloride: 103 mmol/L (ref 101–111)
Creatinine, Ser: 0.95 mg/dL (ref 0.61–1.24)
GFR calc Af Amer: >60 mL/min (ref >60)
Glucose, Bld: 116 mg/dL — ABNORMAL HIGH (ref 65–99)
Potassium: 4.1 mmol/L (ref 3.5–5.1)
SODIUM: 136 mmol/L (ref 135–145)

## 2017-12-31 LAB — CBC
HCT: 42 % (ref 39.0–52.0)
Hemoglobin: 14 g/dL (ref 13.0–17.0)
MCH: 29.5 pg (ref 26.0–34.0)
MCHC: 33.3 g/dL (ref 30.0–36.0)
MCV: 88.6 fL (ref 78.0–100.0)
PLATELETS: 245 10*3/uL (ref 150–400)
RBC: 4.74 MIL/uL (ref 4.22–5.81)
RDW: 13.5 % (ref 11.5–15.5)
WBC: 9.6 10*3/uL (ref 4.0–10.5)

## 2017-12-31 NOTE — Progress Notes (Signed)
Physical Therapy Treatment Patient Details Name: Tony Valdez MRN: [id number] DOB: 03-Nov-1975 Today's Date: 12/31/2017    History of Present Illness Pt is a 42 y/o male s/p R unicompartmental knee replacement. PMH includes obesity, HTN, and L ankle surgery.     PT Comments    Pt performed gait and functional mobility. C/o pain to lateral and posterior aspects of R knee.  Pt slow and guarded due to pain but progressing well.  Plan for afternoon session before d/c home.  Reviewed HEP this session and frequency.    Follow Up Recommendations  Follow surgeon's recommendation for DC plan and follow-up therapies;Supervision for mobility/OOB     Equipment Recommendations  Rolling walker with 5" wheels;3in1 (PT)(bariatric RW and bariatric 3:1.  )    Recommendations for Other Services       Precautions / Restrictions Precautions Precautions: Knee Precaution Booklet Issued: Yes (comment) Precaution Comments: Reviewed knee precautions and supine HEP.  Restrictions Weight Bearing Restrictions: Yes RLE Weight Bearing: Weight bearing as tolerated    Mobility  Bed Mobility Overal bed mobility: Needs Assistance       Supine to sit: Supervision     General bed mobility comments: Supervision for safety.   Transfers Overall transfer level: Needs assistance Equipment used: Rolling walker (2 wheeled) Transfers: Sit to/from Stand Sit to Stand: Supervision         General transfer comment: Cues for hand placement to push from seated surface.  Pt pushing through RW.  Educated for improved safety.    Ambulation/Gait Ambulation/Gait assistance: Supervision Ambulation Distance (Feet): 300 Feet Assistive device: Rolling walker (2 wheeled) Gait Pattern/deviations: Step-through pattern;Trunk flexed;Antalgic;Wide base of support;Decreased stance time - right Gait velocity: Decreased    General Gait Details: Cues for progression to step through, Upper trunk control ( patient throwing  weight forward during R stance time), and RW safety.  Pt also required cues for backing with correct sequencing.     Stairs Stairs: (Pt denies stairs to enter home.  )           Wheelchair Mobility    Modified Rankin (Stroke Patients Only)       Balance Overall balance assessment: Needs assistance Sitting-balance support: No upper extremity supported;Feet supported Sitting balance-Leahy Scale: Good       Standing balance-Leahy Scale: Fair Standing balance comment: Slight buckling in R stance phase.  Cues for R heel strike to improved quad control.  Heavy reliance on UEs.                              Cognition Arousal/Alertness: Awake/alert Behavior During Therapy: WFL for tasks assessed/performed Overall Cognitive Status: Within Functional Limits for tasks assessed                                        Exercises Total Joint Exercises Ankle Circles/Pumps: AROM;Both;20 reps;Supine Quad Sets: AROM;Right;10 reps;Supine Towel Squeeze: AROM;Both;10 reps;Supine Heel Slides: AROM;Right;10 reps;Supine(cues to avoid compensatory movements.  ) Knee Flexion: AROM;Right;10 reps;Supine Goniometric ROM: AROM 73 degrees R knee.      General Comments        Pertinent Vitals/Pain Pain Assessment: 0-10 Pain Score: 6  Pain Location: R knee  Pain Descriptors / Indicators: Aching;Operative site guarding Pain Intervention(s): Monitored during session;Repositioned;Ice applied    Home Living  Prior Function            PT Goals (current goals can now be found in the care plan section) Acute Rehab PT Goals Patient Stated Goal: to decrease pain  Potential to Achieve Goals: Good Progress towards PT goals: Progressing toward goals    Frequency    7X/week      PT Plan Current plan remains appropriate    Co-evaluation              AM-PAC PT "6 Clicks" Daily Activity  Outcome Measure  Difficulty turning  over in bed (including adjusting bedclothes, sheets and blankets)?: None Difficulty moving from lying on back to sitting on the side of the bed? : None Difficulty sitting down on and standing up from a chair with arms (e.g., wheelchair, bedside commode, etc,.)?: None Help needed moving to and from a bed to chair (including a wheelchair)?: None Help needed walking in hospital room?: A Little Help needed climbing 3-5 steps with a railing? : A Little 6 Click Score: 22    End of Session Equipment Utilized During Treatment: Gait belt Activity Tolerance: Patient limited by pain Patient left: in chair;with call bell/phone within reach;with nursing/sitter in room Nurse Communication: Mobility status PT Visit Diagnosis: Other abnormalities of gait and mobility (R26.89);Pain Pain - Right/Left: Right Pain - part of body: Knee     Time: 9604-5409 PT Time Calculation (min) (ACUTE ONLY): 29 min  Charges:  $Gait Training: 8-22 mins $Therapeutic Exercise: 8-22 mins                    G Codes:       Joycelyn Rua, PTA pager [phone]    Florestine Avers 12/31/2017, 10:21 AM

## 2017-12-31 NOTE — Anesthesia Postprocedure Evaluation (Signed)
Anesthesia Post Note  Patient: Tony Valdez  Procedure(s) Performed: UNICOMPARTMENTAL KNEE (Right Knee)     Patient location during evaluation: PACU Anesthesia Type: General Level of consciousness: awake and alert Pain management: pain level controlled Vital Signs Assessment: post-procedure vital signs reviewed and stable Respiratory status: spontaneous breathing, nonlabored ventilation, respiratory function stable and patient connected to nasal cannula oxygen Cardiovascular status: blood pressure returned to baseline and stable Postop Assessment: no apparent nausea or vomiting Anesthetic complications: no    Last Vitals:  Vitals:   12/31/17 0041 12/31/17 0535  BP: (!) 150/94 119/70  Pulse: 68 63  Resp:    Temp: 36.7 C 36.4 C  SpO2: 98% 96%    Last Pain:  Vitals:   12/31/17 0813  TempSrc:   PainSc: 6                  Imad Shostak

## 2017-12-31 NOTE — Discharge Summary (Signed)
Physician Discharge Summary  Patient ID: Tony Valdez MRN: [id number] DOB/AGE: 43-Nov-1977 43 y.o.  Admit date: 12/30/2017 Discharge date: 12/31/2017  Admission Diagnoses:  Primary localized osteoarthritis of right knee  Discharge Diagnoses:  Principal Problem:   Primary localized osteoarthritis of right knee Active Problems:   Severe obesity (BMI >= 40) (HCC)   S/P knee replacement   Past Medical History:  Diagnosis Date  . Hypertension   . Primary localized osteoarthritis of right knee 12/30/2017  . Severe obesity (BMI >= 40) (HCC) 12/30/2017  . Tachycardia     Surgeries: Procedure(s): UNICOMPARTMENTAL KNEE on 12/30/2017   Consultants (if any):   Discharged Condition: Improved  Hospital Course: Tony Valdez is an 42 y.o. male who was admitted 12/30/2017 with a diagnosis of Primary localized osteoarthritis of right knee and went to the operating room on 12/30/2017 and underwent the above named procedures.    He was given perioperative antibiotics:  Anti-infectives (From admission, onward)   Start     Dose/Rate Route Frequency Ordered Stop   12/30/17 1800  ceFAZolin (ANCEF) IVPB 2g/100 mL premix     2 g 200 mL/hr over 30 Minutes Intravenous Every 6 hours 12/30/17 1356 12/31/17 0102   12/30/17 0930  ceFAZolin (ANCEF) 3 g in dextrose 5 % 50 mL IVPB     3 g 130 mL/hr over 30 Minutes Intravenous To ShortStay [facility] 12/29/17 1342 12/30/17 1055    .  He was given sequential compression devices, early ambulation, and xarelto for DVT prophylaxis.  He benefited maximally from the hospital stay and there were no complications.    Recent vital signs:  Vitals:   12/31/17 0041 12/31/17 0535  BP: (!) 150/94 119/70  Pulse: 68 63  Resp:    Temp: 98 F (36.7 C) 97.6 F (36.4 C)  SpO2: 98% 96%    Recent laboratory studies:  Lab Results  Component Value Date   HGB 14.0 12/31/2017   HGB 16.1 12/19/2017   HGB 17.0 02/08/2016   Lab Results  Component Value Date   WBC 9.6  12/31/2017   PLT 245 12/31/2017   No results found for: INR Lab Results  Component Value Date   NA 136 12/31/2017   K 4.1 12/31/2017   CL 103 12/31/2017   CO2 27 12/31/2017   BUN 17 12/31/2017   CREATININE 0.95 12/31/2017   GLUCOSE 116 (H) 12/31/2017    Discharge Medications:   Allergies as of 12/31/2017      Reactions   Codeine Nausea And Vomiting   GI UPSET      Medication List    STOP taking these medications   ibuprofen 200 MG tablet Commonly known as:  ADVIL,MOTRIN     TAKE these medications   baclofen 10 MG tablet Commonly known as:  LIORESAL Take 1 tablet (10 mg total) by mouth 3 (three) times daily. As needed for muscle spasm   losartan 100 MG tablet Commonly known as:  COZAAR Take 100 mg by mouth daily.   metoprolol succinate 50 MG 24 hr tablet Commonly known as:  TOPROL-XL Take 1 tablet (50 mg total) by mouth daily.   nicotine 21 mg/24hr patch Commonly known as:  NICODERM CQ - dosed in mg/24 hours Place 21 mg onto the skin daily.   ondansetron 4 MG tablet Commonly known as:  ZOFRAN Take 1 tablet (4 mg total) by mouth every 8 (eight) hours as needed for nausea or vomiting.   oxyCODONE 5 MG immediate release tablet Commonly known as:  ROXICODONE Take 1 tablet (5 mg total) by mouth every 4 (four) hours as needed for severe pain.   rivaroxaban 10 MG Tabs tablet Commonly known as:  XARELTO Take 1 tablet (10 mg total) by mouth daily.   sennosides-docusate sodium 8.6-50 MG tablet Commonly known as:  SENOKOT-S Take 2 tablets by mouth daily.   WELLBUTRIN SR 150 MG 12 hr tablet Generic drug:  buPROPion Take 300 mg by mouth daily.       Diagnostic Studies: Dg Knee Right Port  Result Date: 12/30/2017 CLINICAL DATA:  Status post partial knee replacement EXAM: PORTABLE RIGHT KNEE - 1-2 VIEW COMPARISON:  None. FINDINGS: Partial knee replacement is noted medially. Mild degenerative changes are noted in the patellofemoral space as well as the lateral joint  space. No acute bony or soft tissue abnormality is seen. IMPRESSION: Medial partial knee replacement. Electronically Signed   By: Alcide Clever M.D.   On: 12/30/2017 15:09    Disposition:     Follow-up Information    Teryl Lucy, MD. Schedule an appointment as soon as possible for a visit in 2 weeks.   Specialty:  Orthopedic Surgery Contact information: [address] [place] [address] [phone]            Signed: Eulas Post 12/31/2017, 7:56 AM

## 2017-12-31 NOTE — Progress Notes (Signed)
RN gave patient discharge instructions and pt stated understanding. IV has been removed and pt has been given pain medication prior to long drive. awiaitng lunch and ride

## 2017-12-31 NOTE — Care Management Note (Signed)
Case Management Note  Patient Details  Name: Tony Valdez MRN: [id number] Date of Birth: 03-07-1976  Subjective/Objective: 42 yr old gentleman s/p right unicompartmental knee replacement.                   Action/Plan: Case manager spoke with patient concerning discharge plan. Patient will be setup for outpatient therapy by Dr.'s office. Patient will go home with his parents.    Expected Discharge Date:  12/31/17               Expected Discharge Plan:  Home/Self Care  In-House Referral:  NA  Discharge planning Services  CM Consult  Post Acute Care Choice:  Durable Medical Equipment Choice offered to:  NA  DME Arranged:  Dan Humphreys wide DME Agency:  [employer]  HH Arranged:  (S) NA(patient will go to outpatient therapy) HH Agency:  NA  Status of Service:  Completed, signed off  If discussed at Long Length of Stay Meetings, dates discussed:    Additional Comments:  Durenda Guthrie, RN 12/31/2017, 11:21 AM

## 2017-12-31 NOTE — Progress Notes (Addendum)
Patient ID: Tony Valdez, male   DOB: [birth year], 42 y.o.   MRN: [id number]     Subjective:  Patient reports pain as mild.  Patient in bed and in no acute distress  Objective:   VITALS:   Vitals:   12/30/17 1700 12/30/17 2017 12/31/17 0041 12/31/17 0535  BP: 113/87 (!) 133/97 (!) 150/94 119/70  Pulse: 95 69 68 63  Resp: 16     Temp:  97.7 F (36.5 C) 98 F (36.7 C) 97.6 F (36.4 C)  TempSrc:  Oral Oral Oral  SpO2: 95% 98% 98% 96%  Weight:      Height:        ABD soft Sensation intact distally Dorsiflexion/Plantar flexion intact Incision: dressing C/D/I and no drainage   Lab Results  Component Value Date   WBC 9.6 12/31/2017   HGB 14.0 12/31/2017   HCT 42.0 12/31/2017   MCV 88.6 12/31/2017   PLT 245 12/31/2017   BMET    Component Value Date/Time   NA 136 12/31/2017 0351   K 4.1 12/31/2017 0351   CL 103 12/31/2017 0351   CO2 27 12/31/2017 0351   GLUCOSE 116 (H) 12/31/2017 0351   BUN 17 12/31/2017 0351   CREATININE 0.95 12/31/2017 0351   CALCIUM 8.5 (L) 12/31/2017 0351   GFRNONAA >60 12/31/2017 0351   GFRAA >60 12/31/2017 0351     Assessment/Plan: 1 Day Post-Op   Principal Problem:   Primary localized osteoarthritis of right knee Active Problems:   Severe obesity (BMI >= 40) (HCC)   S/P knee replacement   Advance diet Up with therapy Plan for DC home after PT in the AM WBAT Dry dressing PRN     Patient's anticipated LOS is less than 2 midnights, meeting these requirements: - Younger than 19 - Lives within 1 hour of care - Has a competent adult at home to recover with post-op recover - NO history of  - Chronic pain requiring opiods  - Diabetes  - Coronary Artery Disease  - Heart failure  - Heart attack  - Stroke  - DVT/VTE  - Cardiac arrhythmia  - Respiratory Failure/COPD  - Renal failure  - Anemia  - Advanced Liver disease        DOUGLAS PARRY, BRANDON 12/31/2017, 7:51 AM  Seen and agree with above.   Teryl Lucy,  MD Cell [phone]

## 2017-12-31 NOTE — Discharge Instructions (Signed)
INSTRUCTIONS AFTER JOINT REPLACEMENT  ° °o Remove items at home which could result in a fall. This includes throw rugs or furniture in walking pathways °o ICE to the affected joint every three hours while awake for 30 minutes at a time, for at least the first 3-5 days, and then as needed for pain and swelling.  Continue to use ice for pain and swelling. You may notice swelling that will progress down to the foot and ankle.  This is normal after surgery.  Elevate your leg when you are not up walking on it.   °o Continue to use the breathing machine you got in the hospital (incentive spirometer) which will help keep your temperature down.  It is common for your temperature to cycle up and down following surgery, especially at night when you are not up moving around and exerting yourself.  The breathing machine keeps your lungs expanded and your temperature down. ° ° °DIET:  As you were doing prior to hospitalization, we recommend a well-balanced diet. ° °DRESSING / WOUND CARE / SHOWERING ° °You may change your dressing 3-5 days after surgery.  Then change the dressing every day with sterile gauze.  Please use good hand washing techniques before changing the dressing.  Do not use any lotions or creams on the incision until instructed by your surgeon. ° °ACTIVITY ° °o Increase activity slowly as tolerated, but follow the weight bearing instructions below.   °o No driving for 6 weeks or until further direction given by your physician.  You cannot drive while taking narcotics.  °o No lifting or carrying greater than 10 lbs. until further directed by your surgeon. °o Avoid periods of inactivity such as sitting longer than an hour when not asleep. This helps prevent blood clots.  °o You may return to work once you are authorized by your doctor.  ° ° ° °WEIGHT BEARING  ° °Weight bearing as tolerated with assist device (walker, cane, etc) as directed, use it as long as suggested by your surgeon or therapist, typically at  least 4-6 weeks. ° ° °EXERCISES ° °Results after joint replacement surgery are often greatly improved when you follow the exercise, range of motion and muscle strengthening exercises prescribed by your doctor. Safety measures are also important to protect the joint from further injury. Any time any of these exercises cause you to have increased pain or swelling, decrease what you are doing until you are comfortable again and then slowly increase them. If you have problems or questions, call your caregiver or physical therapist for advice.  ° °Rehabilitation is important following a joint replacement. After just a few days of immobilization, the muscles of the leg can become weakened and shrink (atrophy).  These exercises are designed to build up the tone and strength of the thigh and leg muscles and to improve motion. Often times heat used for twenty to thirty minutes before working out will loosen up your tissues and help with improving the range of motion but do not use heat for the first two weeks following surgery (sometimes heat can increase post-operative swelling).  ° °These exercises can be done on a training (exercise) mat, on the floor, on a table or on a bed. Use whatever works the best and is most comfortable for you.    Use music or television while you are exercising so that the exercises are a pleasant break in your day. This will make your life better with the exercises acting as a break   in your routine that you can look forward to.   Perform all exercises about fifteen times, three times per day or as directed.  You should exercise both the operative leg and the other leg as well. ° °Exercises include: °  °• Quad Sets - Tighten up the muscle on the front of the thigh (Quad) and hold for 5-10 seconds.   °• Straight Leg Raises - With your knee straight (if you were given a brace, keep it on), lift the leg to 60 degrees, hold for 3 seconds, and slowly lower the leg.  Perform this exercise against  resistance later as your leg gets stronger.  °• Leg Slides: Lying on your back, slowly slide your foot toward your buttocks, bending your knee up off the floor (only go as far as is comfortable). Then slowly slide your foot back down until your leg is flat on the floor again.  °• Angel Wings: Lying on your back spread your legs to the side as far apart as you can without causing discomfort.  °• Hamstring Strength:  Lying on your back, push your heel against the floor with your leg straight by tightening up the muscles of your buttocks.  Repeat, but this time bend your knee to a comfortable angle, and push your heel against the floor.  You may put a pillow under the heel to make it more comfortable if necessary.  ° °A rehabilitation program following joint replacement surgery can speed recovery and prevent re-injury in the future due to weakened muscles. Contact your doctor or a physical therapist for more information on knee rehabilitation.  ° ° °CONSTIPATION ° °Constipation is defined medically as fewer than three stools per week and severe constipation as less than one stool per week.  Even if you have a regular bowel pattern at home, your normal regimen is likely to be disrupted due to multiple reasons following surgery.  Combination of anesthesia, postoperative narcotics, change in appetite and fluid intake all can affect your bowels.  ° °YOU MUST use at least one of the following options; they are listed in order of increasing strength to get the job done.  They are all available over the counter, and you may need to use some, POSSIBLY even all of these options:   ° °Drink plenty of fluids (prune juice may be helpful) and high fiber foods °Colace 100 mg by mouth twice a day  °Senokot for constipation as directed and as needed Dulcolax (bisacodyl), take with full glass of water  °Miralax (polyethylene glycol) once or twice a day as needed. ° °If you have tried all these things and are unable to have a bowel  movement in the first 3-4 days after surgery call either your surgeon or your primary doctor.   ° °If you experience loose stools or diarrhea, hold the medications until you stool forms back up.  If your symptoms do not get better within 1 week or if they get worse, check with your doctor.  If you experience "the worst abdominal pain ever" or develop nausea or vomiting, please contact the office immediately for further recommendations for treatment. ° ° °ITCHING:  If you experience itching with your medications, try taking only a single pain pill, or even half a pain pill at a time.  You can also use Benadryl over the counter for itching or also to help with sleep.  ° °TED HOSE STOCKINGS:  Use stockings on both legs until for at least 2 weeks or as   directed by physician office. They may be removed at night for sleeping. ° °MEDICATIONS:  See your medication summary on the “After Visit Summary” that nursing will review with you.  You may have some home medications which will be placed on hold until you complete the course of blood thinner medication.  It is important for you to complete the blood thinner medication as prescribed. ° °PRECAUTIONS:  If you experience chest pain or shortness of breath - call 911 immediately for transfer to the hospital emergency department.  ° °If you develop a fever greater that 101 F, purulent drainage from wound, increased redness or drainage from wound, foul odor from the wound/dressing, or calf pain - CONTACT YOUR SURGEON.   °                                                °FOLLOW-UP APPOINTMENTS:  If you do not already have a post-op appointment, please call the office for an appointment to be seen by your surgeon.  Guidelines for how soon to be seen are listed in your “After Visit Summary”, but are typically between 1-4 weeks after surgery. ° ° °MAKE SURE YOU:  °• Understand these instructions.  °• Get help right away if you are not doing well or get worse.  ° ° °Thank you for  letting us be a part of your medical care team.  It is a privilege we respect greatly.  We hope these instructions will help you stay on track for a fast and full recovery!  ° ° ° °Information on my medicine - XARELTO® (Rivaroxaban) ° °This medication education was reviewed with me or my healthcare representative as part of my discharge preparation.  The pharmacist that spoke with me during my hospital stay was:  Ileta Ofarrell Dien, RPH ° °Why was Xarelto® prescribed for you? °Xarelto® was prescribed for you to reduce the risk of blood clots forming after orthopedic surgery. The medical term for these abnormal blood clots is venous thromboembolism (VTE). ° °What do you need to know about xarelto® ? °Take your Xarelto® ONCE DAILY at the same time every day. °You may take it either with or without food. ° °If you have difficulty swallowing the tablet whole, you may crush it and mix in applesauce just prior to taking your dose. ° °Take Xarelto® exactly as prescribed by your doctor and DO NOT stop taking Xarelto® without talking to the doctor who prescribed the medication.  Stopping without other VTE prevention medication to take the place of Xarelto® may increase your risk of developing a clot. ° °After discharge, you should have regular check-up appointments with your healthcare provider that is prescribing your Xarelto®.   ° °What do you do if you miss a dose? °If you miss a dose, take it as soon as you remember on the same day then continue your regularly scheduled once daily regimen the next day. Do not take two doses of Xarelto® on the same day.  ° °Important Safety Information °A possible side effect of Xarelto® is bleeding. You should call your healthcare provider right away if you experience any of the following: °? Bleeding from an injury or your nose that does not stop. °? Unusual colored urine (red or dark brown) or unusual colored stools (red or black). °? Unusual bruising for unknown reasons. °? A serious  fall   or if you hit your head (even if there is no bleeding). ° °Some medicines may interact with Xarelto® and might increase your risk of bleeding while on Xarelto®. To help avoid this, consult your healthcare provider or pharmacist prior to using any new prescription or non-prescription medications, including herbals, vitamins, non-steroidal anti-inflammatory drugs (NSAIDs) and supplements. ° °This website has more information on Xarelto®: www.xarelto.com. ° ° ° ° °

## 2018-02-06 ENCOUNTER — Other Ambulatory Visit: Payer: Self-pay | Admitting: Orthopedic Surgery

## 2018-02-09 NOTE — Pre-Procedure Instructions (Signed)
Tony Valdez  02/09/2018      [id number] - [place] - [address] [place] [id number] Phone: [phone] Fax: [phone]    Your procedure is scheduled on July 2  [facility] Admitting at 0530 A.M.  Call this number if you have problems the morning of surgery:  [phone]   Remember:  NOTHING TO EAT OR DRINK AFTER MIDNIGHT    Take these medicines the morning of surgery with A SIP OF WATER  baclofen (LIORESAL) buPROPion (WELLBUTRIN  metoprolol succinate (TOPROL-XL)  7 days prior to surgery STOP taking any Aspirin(unless otherwise instructed by your surgeon), Aleve, Naproxen, Ibuprofen, Motrin, Advil, Goody's, BC's, all herbal medications, fish oil, and all vitamins  FOLLOW PHYSICIANS INSTRUCTIONS ABOUT XARELTO    Do not wear jewelry  Do not wear lotions, powders, or COLOGNE, or deodorant.  Men may shave face and neck.  Do not bring valuables to the hospital.  [facility] is not responsible for any belongings or valuables.  Contacts, dentures or bridgework may not be worn into surgery.  Leave your suitcase in the car.  After surgery it may be brought to your room.  For patients admitted to the hospital, discharge time will be determined by your treatment team.  Patients discharged the day of surgery will not be allowed to drive home.    Special instructions:   [facility]- Preparing For Surgery  Before surgery, you can play an important role. Because skin is not sterile, your skin needs to be as free of germs as possible. You can reduce the number of germs on your skin by washing with CHG (chlorahexidine gluconate) Soap before surgery.  CHG is an antiseptic cleaner which kills germs and bonds with the skin to continue killing germs even after washing.    Oral Hygiene is also important to reduce your risk of infection.  Remember - BRUSH YOUR TEETH THE MORNING OF SURGERY WITH YOUR REGULAR  TOOTHPASTE  Please do not use if you have an allergy to CHG or antibacterial soaps. If your skin becomes reddened/irritated stop using the CHG.  Do not shave (including legs and underarms) for at least 48 hours prior to first CHG shower. It is OK to shave your face.  Please follow these instructions carefully.   1. Shower the NIGHT BEFORE SURGERY and the MORNING OF SURGERY with CHG.   2. If you chose to wash your hair, wash your hair first as usual with your normal shampoo.  3. After you shampoo, rinse your hair and body thoroughly to remove the shampoo.  4. Use CHG as you would any other liquid soap. You can apply CHG directly to the skin and wash gently with a scrungie or a clean washcloth.   5. Apply the CHG Soap to your body ONLY FROM THE NECK DOWN.  Do not use on open wounds or open sores. Avoid contact with your eyes, ears, mouth and genitals (private parts). Wash Face and genitals (private parts)  with your normal soap.  6. Wash thoroughly, paying special attention to the area where your surgery will be performed.  7. Thoroughly rinse your body with warm water from the neck down.  8. DO NOT shower/wash with your normal soap after using and rinsing off the CHG Soap.  9. Pat yourself dry with a CLEAN TOWEL.  10. Wear CLEAN PAJAMAS to bed the night before surgery, wear comfortable clothes the morning  of surgery  11. Place CLEAN SHEETS on your bed the night of your first shower and DO NOT SLEEP WITH PETS.    Day of Surgery:  Do not apply any deodorants/lotions.  Please wear clean clothes to the hospital/surgery center.   Remember to brush your teeth WITH YOUR REGULAR TOOTHPASTE.    Please read over the following fact sheets that you were given.

## 2018-02-10 ENCOUNTER — Encounter (HOSPITAL_COMMUNITY)
Admission: RE | Admit: 2018-02-10 | Discharge: 2018-02-10 | Disposition: A | Discharge status: Home / Self Care | Payer: BLUE CROSS/BLUE SHIELD | Source: Ambulatory Visit | Attending: Orthopedic Surgery | Admitting: Orthopedic Surgery

## 2018-02-10 ENCOUNTER — Encounter (HOSPITAL_COMMUNITY): Payer: Self-pay

## 2018-02-10 DIAGNOSIS — Z01812 Encounter for preprocedural laboratory examination: Secondary | ICD-10-CM | POA: Diagnosis not present

## 2018-02-10 HISTORY — DX: Cardiac arrhythmia, unspecified: I49.9

## 2018-02-10 LAB — BASIC METABOLIC PANEL
Anion gap: 9 (ref 5–15)
BUN: 11 mg/dL (ref 6–20)
CALCIUM: 9.2 mg/dL (ref 8.9–10.3)
CO2: 26 mmol/L (ref 22–32)
Chloride: 104 mmol/L (ref 101–111)
Creatinine, Ser: 0.89 mg/dL (ref 0.61–1.24)
GFR calc Af Amer: >60 mL/min (ref >60)
GLUCOSE: 100 mg/dL — AB (ref 65–99)
Potassium: 4.3 mmol/L (ref 3.5–5.1)
Sodium: 139 mmol/L (ref 135–145)

## 2018-02-10 LAB — CBC
HCT: 46.7 % (ref 39.0–52.0)
HEMOGLOBIN: 15.4 g/dL (ref 13.0–17.0)
MCH: 29 pg (ref 26.0–34.0)
MCHC: 33 g/dL (ref 30.0–36.0)
MCV: 87.9 fL (ref 78.0–100.0)
Platelets: 323 10*3/uL (ref 150–400)
RBC: 5.31 MIL/uL (ref 4.22–5.81)
RDW: 13.2 % (ref 11.5–15.5)
WBC: 7.8 10*3/uL (ref 4.0–10.5)

## 2018-02-10 LAB — SURGICAL PCR SCREEN
MRSA, PCR: NEGATIVE
STAPHYLOCOCCUS AUREUS: NEGATIVE

## 2018-02-10 NOTE — Pre-Procedure Instructions (Signed)
Tony Valdez  02/10/2018      [id number] Valdez - [place], [place] - [address] [place] [address] Phone: [phone] Fax: [phone]    Your procedure is scheduled on July 2  [facility] at 0530 A.M.  Call this number if you have problems the morning of surgery:  [phone]   Remember:  NOTHING TO EAT OR DRINK AFTER MIDNIGHT    Take these medicines the morning of surgery with A SIP OF WATER  baclofen (LIORESAL) buPROPion (WELLBUTRIN  metoprolol succinate (TOPROL-XL)  7 days prior to surgery STOP taking any Aspirin(unless otherwise instructed by your surgeon), Aleve, Naproxen, Ibuprofen, Motrin, Advil, Goody's, BC's, all herbal medications, fish oil, and all vitamins      Do not wear jewelry  Do not wear lotions, powders, or COLOGNE, or deodorant.  Men may shave face and neck.  Do not bring valuables to the hospital.  [facility] is not responsible for any belongings or valuables.  Contacts, dentures or bridgework may not be worn into surgery.  Leave your suitcase in the car.  After surgery it may be brought to your room.  For patients admitted to the hospital, discharge time will be determined by your treatment team.  Patients discharged the day of surgery will not be allowed to drive home.    Special instructions:   [facility]- Preparing For Surgery  Before surgery, you can play an important role. Because skin is not sterile, your skin needs to be as free of germs as possible. You can reduce the number of germs on your skin by washing with CHG (chlorahexidine gluconate) Soap before surgery.  CHG is an antiseptic cleaner which kills germs and bonds with the skin to continue killing germs even after washing.    Oral Hygiene is also important to reduce your risk of infection.  Remember - BRUSH YOUR TEETH THE MORNING OF SURGERY WITH YOUR REGULAR TOOTHPASTE  Please do not use if you have an allergy to  CHG or antibacterial soaps. If your skin becomes reddened/irritated stop using the CHG.  Do not shave (including legs and underarms) for at least 48 hours prior to first CHG shower. It is OK to shave your face.  Please follow these instructions carefully.   1. Shower the NIGHT BEFORE SURGERY and the MORNING OF SURGERY with CHG.   2. If you chose to wash your hair, wash your hair first as usual with your normal shampoo.  3. After you shampoo, rinse your hair and body thoroughly to remove the shampoo.  4. Use CHG as you would any other liquid soap. You can apply CHG directly to the skin and wash gently with a scrungie or a clean washcloth.   5. Apply the CHG Soap to your body ONLY FROM THE NECK DOWN.  Do not use on open wounds or open sores. Avoid contact with your eyes, ears, mouth and genitals (private parts). Wash Face and genitals (private parts)  with your normal soap.  6. Wash thoroughly, paying special attention to the area where your surgery will be performed.  7. Thoroughly rinse your body with warm water from the neck down.  8. DO NOT shower/wash with your normal soap after using and rinsing off the CHG Soap.  9. Pat yourself dry with a CLEAN TOWEL.  10. Wear CLEAN PAJAMAS to bed the night before surgery, wear comfortable clothes the morning of surgery  11.  Place CLEAN SHEETS on your bed the night of your first shower and DO NOT SLEEP WITH PETS.    Day of Surgery:  Do not apply any deodorants/lotions.  Please wear clean clothes to the hospital/surgery center.   Remember to brush your teeth WITH YOUR REGULAR TOOTHPASTE.    Please read over the following fact sheets that you were given.

## 2018-02-10 NOTE — Progress Notes (Signed)
Cardiologist Dr. Kirke CorinArida  Cardiac clearance from surgery in April in Epic 12-18-2017  Echo 2017  Stress 2013  Denies any recent cardiac problems

## 2018-02-13 ENCOUNTER — Other Ambulatory Visit (HOSPITAL_COMMUNITY): Payer: BLUE CROSS/BLUE SHIELD

## 2018-02-23 MED ORDER — DEXTROSE 5 % IV SOLN
3.0000 g | INTRAVENOUS | Status: AC
  Administered 2018-02-24: 3 g via INTRAVENOUS
  Filled 2018-02-23: qty 3

## 2018-02-23 NOTE — Anesthesia Preprocedure Evaluation (Addendum)
Anesthesia Evaluation  Patient identified by MRN, date of birth, ID band Patient awake    Reviewed: Allergy & Precautions, H&P , NPO status , Patient's Chart, lab work & pertinent test results, reviewed documented beta blocker date and time   Airway Mallampati: II  TM Distance: >3 FB Neck ROM: full    Dental no notable dental hx. (+) Teeth Intact, Dental Advisory Given   Pulmonary neg pulmonary ROS, Current Smoker,    Pulmonary exam normal breath sounds clear to auscultation       Cardiovascular Exercise Tolerance: Good hypertension, Pt. on medications and Pt. on home beta blockers + dysrhythmias Supra Ventricular Tachycardia  Rhythm:regular Rate:Normal  ECHO 7/16  Systolic function was normal. The estimated ejection   fraction was in the range of 55% to 60%.    Neuro/Psych negative neurological ROS  negative psych ROS   GI/Hepatic negative GI ROS, Neg liver ROS,   Endo/Other  negative endocrine ROSMorbid obesity  Renal/GU negative Renal ROS  negative genitourinary   Musculoskeletal  (+) Arthritis , Osteoarthritis,    Abdominal   Peds  Hematology negative hematology ROS (+)   Anesthesia Other Findings   Reproductive/Obstetrics negative OB ROS                            Anesthesia Physical  Anesthesia Plan  ASA: II  Anesthesia Plan: Spinal   Post-op Pain Management:  Regional for Post-op pain   Induction:   PONV Risk Score and Plan: 2 and Treatment may vary due to age or medical condition  Airway Management Planned: Nasal Cannula, Natural Airway and Mask  Additional Equipment:   Intra-op Plan:   Post-operative Plan: Extubation in OR  Informed Consent: I have reviewed the patients History and Physical, chart, labs and discussed the procedure including the risks, benefits and alternatives for the proposed anesthesia with the patient or authorized representative who has  indicated his/her understanding and acceptance.   Dental Advisory Given  Plan Discussed with: CRNA, Anesthesiologist and Surgeon  Anesthesia Plan Comments: (  )        Anesthesia Quick Evaluation

## 2018-02-24 ENCOUNTER — Inpatient Hospital Stay (HOSPITAL_COMMUNITY): Payer: BLUE CROSS/BLUE SHIELD | Admitting: Anesthesiology

## 2018-02-24 ENCOUNTER — Encounter (HOSPITAL_COMMUNITY)
Admission: RE | Disposition: A | Discharge status: Home / Self Care | Payer: Self-pay | Source: Ambulatory Visit | Attending: Orthopedic Surgery

## 2018-02-24 ENCOUNTER — Observation Stay (HOSPITAL_COMMUNITY)
Admission: RE | Admit: 2018-02-24 | Discharge: 2018-02-25 | Disposition: A | Discharge status: Home / Self Care | Payer: BLUE CROSS/BLUE SHIELD | Source: Ambulatory Visit | Attending: Orthopedic Surgery | Admitting: Orthopedic Surgery

## 2018-02-24 ENCOUNTER — Observation Stay (HOSPITAL_COMMUNITY): Payer: BLUE CROSS/BLUE SHIELD

## 2018-02-24 ENCOUNTER — Encounter (HOSPITAL_COMMUNITY): Payer: Self-pay | Admitting: Certified Registered Nurse Anesthetist

## 2018-02-24 ENCOUNTER — Other Ambulatory Visit: Payer: Self-pay

## 2018-02-24 DIAGNOSIS — Z23 Encounter for immunization: Secondary | ICD-10-CM | POA: Diagnosis not present

## 2018-02-24 DIAGNOSIS — M2342 Loose body in knee, left knee: Secondary | ICD-10-CM | POA: Insufficient documentation

## 2018-02-24 DIAGNOSIS — Z79899 Other long term (current) drug therapy: Secondary | ICD-10-CM | POA: Insufficient documentation

## 2018-02-24 DIAGNOSIS — I10 Essential (primary) hypertension: Secondary | ICD-10-CM | POA: Diagnosis not present

## 2018-02-24 DIAGNOSIS — M25762 Osteophyte, left knee: Secondary | ICD-10-CM | POA: Diagnosis not present

## 2018-02-24 DIAGNOSIS — Z96651 Presence of right artificial knee joint: Secondary | ICD-10-CM | POA: Insufficient documentation

## 2018-02-24 DIAGNOSIS — F17210 Nicotine dependence, cigarettes, uncomplicated: Secondary | ICD-10-CM | POA: Insufficient documentation

## 2018-02-24 DIAGNOSIS — Z96652 Presence of left artificial knee joint: Secondary | ICD-10-CM

## 2018-02-24 DIAGNOSIS — M25562 Pain in left knee: Secondary | ICD-10-CM | POA: Diagnosis present

## 2018-02-24 DIAGNOSIS — Z96659 Presence of unspecified artificial knee joint: Secondary | ICD-10-CM

## 2018-02-24 DIAGNOSIS — M23042 Cystic meniscus, anterior horn of lateral meniscus, left knee: Secondary | ICD-10-CM | POA: Insufficient documentation

## 2018-02-24 DIAGNOSIS — M1712 Unilateral primary osteoarthritis, left knee: Secondary | ICD-10-CM | POA: Diagnosis not present

## 2018-02-24 HISTORY — DX: Unilateral primary osteoarthritis, left knee: M17.12

## 2018-02-24 HISTORY — PX: PARTIAL KNEE ARTHROPLASTY: SHX2174

## 2018-02-24 SURGERY — ARTHROPLASTY, KNEE, UNICOMPARTMENTAL
Anesthesia: Spinal | Site: Knee | Laterality: Left

## 2018-02-24 MED ORDER — SUCCINYLCHOLINE CHLORIDE 200 MG/10ML IV SOSY
PREFILLED_SYRINGE | INTRAVENOUS | Status: AC
  Filled 2018-02-24: qty 10

## 2018-02-24 MED ORDER — METOCLOPRAMIDE HCL 5 MG PO TABS: 5.0000 mg | ORAL_TABLET | Freq: Three times a day (TID) | ORAL | Status: DC | PRN

## 2018-02-24 MED ORDER — LOSARTAN POTASSIUM 50 MG PO TABS
100.0000 mg | ORAL_TABLET | Freq: Every day | ORAL | Status: DC
Start: 2018-02-25 — End: 2018-02-25
  Administered 2018-02-25: 100 mg via ORAL
  Filled 2018-02-24: qty 2

## 2018-02-24 MED ORDER — OXYCODONE HCL 5 MG PO TABS
5.0000 mg | ORAL_TABLET | ORAL | Status: DC | PRN
  Administered 2018-02-24: 10 mg via ORAL

## 2018-02-24 MED ORDER — KETOROLAC TROMETHAMINE 15 MG/ML IJ SOLN
INTRAMUSCULAR | Status: AC
  Filled 2018-02-24: qty 1

## 2018-02-24 MED ORDER — MEPERIDINE HCL 50 MG/ML IJ SOLN: 6.2500 mg | INTRAMUSCULAR | Status: DC | PRN

## 2018-02-24 MED ORDER — ONDANSETRON HCL 4 MG/2ML IJ SOLN
INTRAMUSCULAR | Status: AC
  Filled 2018-02-24: qty 2

## 2018-02-24 MED ORDER — PROPOFOL 10 MG/ML IV BOLUS
INTRAVENOUS | Status: AC
  Filled 2018-02-24: qty 20

## 2018-02-24 MED ORDER — DEXTROSE 5 % IV SOLN
INTRAVENOUS | Status: DC | PRN
  Administered 2018-02-24: 20 ug/min via INTRAVENOUS

## 2018-02-24 MED ORDER — ACETAMINOPHEN 500 MG PO TABS
1000.0000 mg | ORAL_TABLET | Freq: Four times a day (QID) | ORAL | Status: AC
  Administered 2018-02-24 – 2018-02-25 (×4): 1000 mg via ORAL
  Filled 2018-02-24 (×4): qty 2

## 2018-02-24 MED ORDER — FENTANYL CITRATE (PF) 100 MCG/2ML IJ SOLN
25.0000 ug | INTRAMUSCULAR | Status: DC | PRN
  Administered 2018-02-24 (×2): 50 ug via INTRAVENOUS

## 2018-02-24 MED ORDER — DIPHENHYDRAMINE HCL 12.5 MG/5ML PO ELIX: 12.5000 mg | ORAL_SOLUTION | ORAL | Status: DC | PRN

## 2018-02-24 MED ORDER — DOCUSATE SODIUM 100 MG PO CAPS
100.0000 mg | ORAL_CAPSULE | Freq: Two times a day (BID) | ORAL | Status: DC
  Administered 2018-02-24 – 2018-02-25 (×2): 100 mg via ORAL
  Filled 2018-02-24 (×2): qty 1

## 2018-02-24 MED ORDER — HYDROMORPHONE HCL 1 MG/ML IJ SOLN
INTRAMUSCULAR | Status: AC
  Filled 2018-02-24: qty 1

## 2018-02-24 MED ORDER — FENTANYL CITRATE (PF) 100 MCG/2ML IJ SOLN
INTRAMUSCULAR | Status: DC | PRN
  Administered 2018-02-24 (×2): 50 ug via INTRAVENOUS

## 2018-02-24 MED ORDER — LACTATED RINGERS IV SOLN
INTRAVENOUS | Status: DC | PRN
  Administered 2018-02-24 (×2): via INTRAVENOUS

## 2018-02-24 MED ORDER — BUPROPION HCL ER (SR) 150 MG PO TB12
150.0000 mg | ORAL_TABLET | Freq: Two times a day (BID) | ORAL | Status: DC
  Administered 2018-02-24 – 2018-02-25 (×2): 150 mg via ORAL
  Filled 2018-02-24 (×2): qty 1

## 2018-02-24 MED ORDER — METHOCARBAMOL 500 MG PO TABS
ORAL_TABLET | ORAL | Status: AC
  Filled 2018-02-24: qty 1

## 2018-02-24 MED ORDER — OXYCODONE HCL 5 MG PO TABS
10.0000 mg | ORAL_TABLET | ORAL | Status: DC | PRN
  Administered 2018-02-24: 10 mg via ORAL
  Administered 2018-02-24 – 2018-02-25 (×3): 15 mg via ORAL
  Filled 2018-02-24 (×4): qty 3

## 2018-02-24 MED ORDER — BUPIVACAINE HCL (PF) 0.25 % IJ SOLN
INTRAMUSCULAR | Status: AC
  Filled 2018-02-24: qty 30

## 2018-02-24 MED ORDER — GABAPENTIN 300 MG PO CAPS
300.0000 mg | ORAL_CAPSULE | Freq: Three times a day (TID) | ORAL | Status: DC
  Administered 2018-02-24 – 2018-02-25 (×3): 300 mg via ORAL
  Filled 2018-02-24 (×3): qty 1

## 2018-02-24 MED ORDER — MIDAZOLAM HCL 2 MG/2ML IJ SOLN
INTRAMUSCULAR | Status: AC
  Filled 2018-02-24: qty 2

## 2018-02-24 MED ORDER — ACETAMINOPHEN 325 MG PO TABS: 325.0000 mg | ORAL_TABLET | Freq: Four times a day (QID) | ORAL | Status: DC | PRN

## 2018-02-24 MED ORDER — ONDANSETRON HCL 4 MG/2ML IJ SOLN: 4.0000 mg | Freq: Four times a day (QID) | INTRAMUSCULAR | Status: DC | PRN

## 2018-02-24 MED ORDER — METOPROLOL SUCCINATE ER 50 MG PO TB24
50.0000 mg | ORAL_TABLET | Freq: Every day | ORAL | Status: DC
  Administered 2018-02-25: 50 mg via ORAL
  Filled 2018-02-24: qty 1

## 2018-02-24 MED ORDER — FENTANYL CITRATE (PF) 250 MCG/5ML IJ SOLN
INTRAMUSCULAR | Status: AC
  Filled 2018-02-24: qty 5

## 2018-02-24 MED ORDER — HYDROMORPHONE HCL 1 MG/ML IJ SOLN
0.5000 mg | INTRAMUSCULAR | Status: DC | PRN
  Administered 2018-02-24 (×2): 1 mg via INTRAVENOUS
  Filled 2018-02-24 (×2): qty 1

## 2018-02-24 MED ORDER — CEFAZOLIN SODIUM-DEXTROSE 2-4 GM/100ML-% IV SOLN
2.0000 g | Freq: Four times a day (QID) | INTRAVENOUS | Status: AC
  Administered 2018-02-24 (×2): 2 g via INTRAVENOUS
  Filled 2018-02-24 (×2): qty 100

## 2018-02-24 MED ORDER — METHOCARBAMOL 500 MG PO TABS
500.0000 mg | ORAL_TABLET | Freq: Four times a day (QID) | ORAL | Status: DC | PRN
  Administered 2018-02-24 – 2018-02-25 (×3): 500 mg via ORAL
  Filled 2018-02-24 (×2): qty 1

## 2018-02-24 MED ORDER — OXYCODONE HCL 5 MG/5ML PO SOLN: 5.0000 mg | Freq: Once | ORAL | Status: DC | PRN

## 2018-02-24 MED ORDER — SODIUM CHLORIDE 0.9 % IR SOLN
Status: DC | PRN
  Administered 2018-02-24: 1000 mL

## 2018-02-24 MED ORDER — BUPIVACAINE IN DEXTROSE 0.75-8.25 % IT SOLN
INTRATHECAL | Status: DC | PRN
  Administered 2018-02-24: 2 mL via INTRATHECAL

## 2018-02-24 MED ORDER — ACETAMINOPHEN 160 MG/5ML PO SOLN: 325.0000 mg | ORAL | Status: DC | PRN

## 2018-02-24 MED ORDER — OXYCODONE HCL 5 MG PO TABS: 5.0000 mg | ORAL_TABLET | Freq: Four times a day (QID) | ORAL | 0 refills | Status: AC | PRN

## 2018-02-24 MED ORDER — ONDANSETRON HCL 4 MG/2ML IJ SOLN: 4.0000 mg | Freq: Once | INTRAMUSCULAR | Status: DC | PRN

## 2018-02-24 MED ORDER — MIDAZOLAM HCL 5 MG/5ML IJ SOLN
INTRAMUSCULAR | Status: DC | PRN
  Administered 2018-02-24: 2 mg via INTRAVENOUS
  Administered 2018-02-24 (×2): 1 mg via INTRAVENOUS

## 2018-02-24 MED ORDER — CHLORHEXIDINE GLUCONATE 4 % EX LIQD: 60.0000 mL | Freq: Once | CUTANEOUS | Status: DC

## 2018-02-24 MED ORDER — NICOTINE 14 MG/24HR TD PT24
14.0000 mg | MEDICATED_PATCH | Freq: Every day | TRANSDERMAL | Status: DC
  Administered 2018-02-25: 14 mg via TRANSDERMAL
  Filled 2018-02-24: qty 1

## 2018-02-24 MED ORDER — METOCLOPRAMIDE HCL 5 MG/ML IJ SOLN: 5.0000 mg | Freq: Three times a day (TID) | INTRAMUSCULAR | Status: DC | PRN

## 2018-02-24 MED ORDER — ASPIRIN EC 325 MG PO TBEC: 325.0000 mg | DELAYED_RELEASE_TABLET | Freq: Every day | ORAL | 0 refills | Status: AC

## 2018-02-24 MED ORDER — 0.9 % SODIUM CHLORIDE (POUR BTL) OPTIME
TOPICAL | Status: DC | PRN
  Administered 2018-02-24: 1000 mL

## 2018-02-24 MED ORDER — PHENYLEPHRINE 40 MCG/ML (10ML) SYRINGE FOR IV PUSH (FOR BLOOD PRESSURE SUPPORT)
PREFILLED_SYRINGE | INTRAVENOUS | Status: DC | PRN
  Administered 2018-02-24 (×3): 80 ug via INTRAVENOUS

## 2018-02-24 MED ORDER — ACETAMINOPHEN 325 MG PO TABS: 325.0000 mg | ORAL_TABLET | ORAL | Status: DC | PRN

## 2018-02-24 MED ORDER — TRANEXAMIC ACID 1000 MG/10ML IV SOLN
1000.0000 mg | Freq: Once | INTRAVENOUS | Status: AC
  Administered 2018-02-24: 1000 mg via INTRAVENOUS
  Filled 2018-02-24: qty 10

## 2018-02-24 MED ORDER — ONDANSETRON HCL 4 MG PO TABS
4.0000 mg | ORAL_TABLET | Freq: Four times a day (QID) | ORAL | Status: DC | PRN
Start: 2018-02-24 — End: 2018-02-25

## 2018-02-24 MED ORDER — POTASSIUM CHLORIDE IN NACL 20-0.45 MEQ/L-% IV SOLN
INTRAVENOUS | Status: DC
  Filled 2018-02-24: qty 1000

## 2018-02-24 MED ORDER — BUPIVACAINE HCL (PF) 0.25 % IJ SOLN
INTRAMUSCULAR | Status: DC | PRN
  Administered 2018-02-24: 20 mL

## 2018-02-24 MED ORDER — FENTANYL CITRATE (PF) 100 MCG/2ML IJ SOLN
INTRAMUSCULAR | Status: AC
  Filled 2018-02-24: qty 2

## 2018-02-24 MED ORDER — KETOROLAC TROMETHAMINE 15 MG/ML IJ SOLN
15.0000 mg | Freq: Four times a day (QID) | INTRAMUSCULAR | Status: AC
  Administered 2018-02-24 – 2018-02-25 (×4): 15 mg via INTRAVENOUS
  Filled 2018-02-24 (×3): qty 1

## 2018-02-24 MED ORDER — BISACODYL 10 MG RE SUPP: 10.0000 mg | Freq: Every day | RECTAL | Status: DC | PRN

## 2018-02-24 MED ORDER — POLYETHYLENE GLYCOL 3350 17 G PO PACK: 17.0000 g | PACK | Freq: Every day | ORAL | Status: DC | PRN

## 2018-02-24 MED ORDER — PNEUMOCOCCAL VAC POLYVALENT 25 MCG/0.5ML IJ INJ
0.5000 mL | INJECTION | INTRAMUSCULAR | Status: AC
  Administered 2018-02-25: 0.5 mL via INTRAMUSCULAR
  Filled 2018-02-24: qty 0.5

## 2018-02-24 MED ORDER — METHOCARBAMOL 1000 MG/10ML IJ SOLN
500.0000 mg | Freq: Four times a day (QID) | INTRAVENOUS | Status: DC | PRN
  Filled 2018-02-24: qty 5

## 2018-02-24 MED ORDER — ASPIRIN EC 325 MG PO TBEC
325.0000 mg | DELAYED_RELEASE_TABLET | Freq: Two times a day (BID) | ORAL | Status: DC
  Administered 2018-02-24 – 2018-02-25 (×3): 325 mg via ORAL
  Filled 2018-02-24 (×3): qty 1

## 2018-02-24 MED ORDER — ZOLPIDEM TARTRATE 5 MG PO TABS: 5.0000 mg | ORAL_TABLET | Freq: Every evening | ORAL | Status: DC | PRN

## 2018-02-24 MED ORDER — PROPOFOL 500 MG/50ML IV EMUL
INTRAVENOUS | Status: DC | PRN
  Administered 2018-02-24: 09:00:00 via INTRAVENOUS
  Administered 2018-02-24: 75 ug/kg/min via INTRAVENOUS

## 2018-02-24 MED ORDER — KETOROLAC TROMETHAMINE 30 MG/ML IJ SOLN
INTRAMUSCULAR | Status: AC
Start: 2018-02-24 — End: ?
  Filled 2018-02-24: qty 1

## 2018-02-24 MED ORDER — PHENOL 1.4 % MT LIQD: 1.0000 | OROMUCOSAL | Status: DC | PRN

## 2018-02-24 MED ORDER — OXYCODONE HCL 5 MG PO TABS
ORAL_TABLET | ORAL | Status: AC
  Filled 2018-02-24: qty 2

## 2018-02-24 MED ORDER — ALUM & MAG HYDROXIDE-SIMETH 200-200-20 MG/5ML PO SUSP: 30.0000 mL | ORAL | Status: DC | PRN

## 2018-02-24 MED ORDER — BACLOFEN 10 MG PO TABS: 10.0000 mg | ORAL_TABLET | Freq: Three times a day (TID) | ORAL | 0 refills | Status: AC | PRN

## 2018-02-24 MED ORDER — MAGNESIUM CITRATE PO SOLN
1.0000 | Freq: Once | ORAL | Status: AC | PRN
  Administered 2018-02-25: 1 via ORAL
  Filled 2018-02-24: qty 296

## 2018-02-24 MED ORDER — MENTHOL 3 MG MT LOZG: 1.0000 | LOZENGE | OROMUCOSAL | Status: DC | PRN

## 2018-02-24 MED ORDER — ROPIVACAINE HCL 7.5 MG/ML IJ SOLN
INTRAMUSCULAR | Status: DC | PRN
  Administered 2018-02-24: 30 mL via PERINEURAL

## 2018-02-24 MED ORDER — OXYCODONE HCL 5 MG PO TABS: 5.0000 mg | ORAL_TABLET | Freq: Once | ORAL | Status: DC | PRN

## 2018-02-24 MED ORDER — DEXAMETHASONE SODIUM PHOSPHATE 10 MG/ML IJ SOLN
10.0000 mg | Freq: Once | INTRAMUSCULAR | Status: AC
  Administered 2018-02-25: 10 mg via INTRAVENOUS
  Filled 2018-02-24: qty 1

## 2018-02-24 SURGICAL SUPPLY — 56 items
BANDAGE ESMARK 6X9 LF (GAUZE/BANDAGES/DRESSINGS) ×1 IMPLANT
BEARING TIBIAL STRL SZ3 LG (Knees) ×3 IMPLANT
BNDG ELASTIC 6X15 VLCR STRL LF (GAUZE/BANDAGES/DRESSINGS) ×3 IMPLANT
BNDG ESMARK 6X9 LF (GAUZE/BANDAGES/DRESSINGS) ×3
BOWL SMART MIX CTS (DISPOSABLE) ×3 IMPLANT
CEMENT BONE R 1X40 (Cement) ×3 IMPLANT
CLOSURE STERI-STRIP 1/2X4 (GAUZE/BANDAGES/DRESSINGS) ×1
CLOSURE WOUND 1/2 X4 (GAUZE/BANDAGES/DRESSINGS) ×1
CLSR STERI-STRIP ANTIMIC 1/2X4 (GAUZE/BANDAGES/DRESSINGS) ×2 IMPLANT
COVER SURGICAL LIGHT HANDLE (MISCELLANEOUS) ×3 IMPLANT
CUFF TOURNIQUET SINGLE 34IN LL (TOURNIQUET CUFF) ×3 IMPLANT
DECANTER SPIKE VIAL GLASS SM (MISCELLANEOUS) ×3 IMPLANT
DRAPE EXTREMITY T 121X128X90 (DRAPE) ×3 IMPLANT
DRAPE HALF SHEET 40X57 (DRAPES) ×3 IMPLANT
DRAPE U-SHAPE 47X51 STRL (DRAPES) ×3 IMPLANT
DRSG MEPILEX BORDER 4X8 (GAUZE/BANDAGES/DRESSINGS) ×3 IMPLANT
DURAPREP 26ML APPLICATOR (WOUND CARE) ×3 IMPLANT
ELECT CAUTERY BLADE 6.4 (BLADE) ×3 IMPLANT
ELECT REM PT RETURN 9FT ADLT (ELECTROSURGICAL) ×3
ELECTRODE REM PT RTRN 9FT ADLT (ELECTROSURGICAL) ×1 IMPLANT
GLOVE BIOGEL PI ORTHO PRO SZ8 (GLOVE) ×4
GLOVE ORTHO TXT STRL SZ7.5 (GLOVE) ×3 IMPLANT
GLOVE PI ORTHO PRO STRL SZ8 (GLOVE) ×2 IMPLANT
GLOVE SURG ORTHO 8.0 STRL STRW (GLOVE) ×3 IMPLANT
GLOVE SURG SS PI 7.0 STRL IVOR (GLOVE) ×6 IMPLANT
GOWN STRL REUS W/ TWL XL LVL3 (GOWN DISPOSABLE) ×1 IMPLANT
GOWN STRL REUS W/TWL 2XL LVL3 (GOWN DISPOSABLE) ×3 IMPLANT
GOWN STRL REUS W/TWL XL LVL3 (GOWN DISPOSABLE) ×2
HANDPIECE INTERPULSE COAX TIP (DISPOSABLE) ×2
HOOD PEEL AWAY FACE SHEILD DIS (HOOD) ×3 IMPLANT
HOOD PEEL AWAY FLYTE STAYCOOL (MISCELLANEOUS) ×3 IMPLANT
IMMOBILIZER KNEE 22 UNIV (SOFTGOODS) ×3 IMPLANT
KIT BASIN OR (CUSTOM PROCEDURE TRAY) ×3 IMPLANT
KIT TURNOVER KIT B (KITS) ×3 IMPLANT
MANIFOLD NEPTUNE II (INSTRUMENTS) ×3 IMPLANT
NEEDLE HYPO 21X1.5 SAFETY (NEEDLE) IMPLANT
NS IRRIG 1000ML POUR BTL (IV SOLUTION) ×3 IMPLANT
PACK BLADE SAW RECIP 70 3 PT (BLADE) ×3 IMPLANT
PACK TOTAL JOINT (CUSTOM PROCEDURE TRAY) ×3 IMPLANT
PAD ARMBOARD 7.5X6 YLW CONV (MISCELLANEOUS) ×6 IMPLANT
PEG FEMORAL CEMENT STRL LRG (Knees) ×3 IMPLANT
SET HNDPC FAN SPRY TIP SCT (DISPOSABLE) ×1 IMPLANT
STRIP CLOSURE SKIN 1/2X4 (GAUZE/BANDAGES/DRESSINGS) ×2 IMPLANT
SUCTION FRAZIER HANDLE 10FR (MISCELLANEOUS) ×2
SUCTION TUBE FRAZIER 10FR DISP (MISCELLANEOUS) ×1 IMPLANT
SUT VIC AB 0 CT1 27 (SUTURE) ×2
SUT VIC AB 0 CT1 27XBRD ANBCTR (SUTURE) ×1 IMPLANT
SUT VIC AB 1 CT1 27 (SUTURE) ×2
SUT VIC AB 1 CT1 27XBRD ANBCTR (SUTURE) ×1 IMPLANT
SUT VIC AB 2-0 CT1 27 (SUTURE) ×2
SUT VIC AB 2-0 CT1 TAPERPNT 27 (SUTURE) ×1 IMPLANT
SUT VIC AB 3-0 SH 8-18 (SUTURE) ×3 IMPLANT
SYR CONTROL 10ML LL (SYRINGE) ×3 IMPLANT
TOWEL OR 17X26 10 PK STRL BLUE (TOWEL DISPOSABLE) ×3 IMPLANT
TRAY CATH 16FR W/PLASTIC CATH (SET/KITS/TRAYS/PACK) ×3 IMPLANT
TRAY TIBIAL OXFORD SZ D LF (Joint) ×3 IMPLANT

## 2018-02-24 NOTE — Progress Notes (Signed)
Pt ready, waiting for transport

## 2018-02-24 NOTE — H&P (Signed)
PREOPERATIVE H&P  Chief Complaint: left knee pain  HPI: Lupita DawnDamon R Solana is a 42 y.o. male who presents for preoperative history and physical with a diagnosis of left knee arthritis. Symptoms are rated as moderate to severe, and have been worsening.  This is significantly impairing activities of daily living.  He has elected for surgical management.   Did well with uni on other side.  He has failed injections, activity modification, anti-inflammatories, and assistive devices.  Preoperative X-rays demonstrate end stage degenerative changes with osteophyte formation, loss of joint space, subchondral sclerosis.   Past Medical History:  Diagnosis Date  . Dysrhythmia    SVT  . Hypertension   . Primary localized osteoarthritis of right knee 12/30/2017  . Severe obesity (BMI >= 40) (HCC) 12/30/2017  . Tachycardia    Past Surgical History:  Procedure Laterality Date  . ANKLE SURGERY Left   . MULTIPLE TOOTH EXTRACTIONS    . PARTIAL KNEE ARTHROPLASTY Right 12/30/2017   Procedure: UNICOMPARTMENTAL KNEE;  Surgeon: Teryl LucyLandau, Onda Kattner, MD;  [facility];  Service: Orthopedics;  Laterality: Right;   Social History   Socioeconomic History  . Marital status: Single    Spouse name: Not on file  . Number of children: Not on file  . Years of education: Not on file  . Highest education level: Not on file  Occupational History  . Not on file  Social Needs  . Financial resource strain: Not on file  . Food insecurity:    Worry: Not on file    Inability: Not on file  . Transportation needs:    Medical: Not on file    Non-medical: Not on file  Tobacco Use  . Smoking status: Current Every Day Smoker    Packs/day: 1.50  . Smokeless tobacco: Never Used  . Tobacco comment: trying to quit, no cigarette in over month  Substance and Sexual Activity  . Alcohol use: Yes    Comment: occ  . Drug use: No  . Sexual activity: Not on file  Lifestyle  . Physical activity:    Days per week: Not on file   Minutes per session: Not on file  . Stress: Not on file  Relationships  . Social connections:    Talks on phone: Not on file    Gets together: Not on file    Attends religious service: Not on file    Active member of club or organization: Not on file    Attends meetings of clubs or organizations: Not on file    Relationship status: Not on file  Other Topics Concern  . Not on file  Social History Narrative  . Not on file   Family History  Problem Relation Age of Onset  . Cancer Mother        bladder  . Hypertension Father   . Heart disease Father    Allergies  Allergen Reactions  . Codeine Nausea And Vomiting and Other (See Comments)    GI UPSET   Prior to Admission medications   Medication Sig Start Date End Date Taking? Authorizing Provider  baclofen (LIORESAL) 10 MG tablet Take 1 tablet (10 mg total) by mouth 3 (three) times daily. As needed for muscle spasm Patient taking differently: Take 10 mg by mouth 3 (three) times daily as needed for muscle spasms.  12/30/17  Yes Teryl LucyLandau, Dayshon Roback, MD  buPROPion [facility](WELLBUTRIN SR) 150 MG 12 hr tablet Take 150 mg by mouth 2 (two) times daily.    Yes [provider]  losartan (COZAAR) 100 MG tablet Take 100 mg by mouth daily.   Yes [provider]  metoprolol succinate (TOPROL-XL) 50 MG 24 hr tablet Take 1 tablet (50 mg total) by mouth daily. 02/13/16  Yes Iran Ouch, MD  nicotine (NICODERM CQ - DOSED IN MG/24 HOURS) 14 mg/24hr patch Place 14 mg onto the skin daily.    Yes [provider]  ondansetron (ZOFRAN) 4 MG tablet Take 1 tablet (4 mg total) by mouth every 8 (eight) hours as needed for nausea or vomiting. Patient not taking: Reported on 02/04/2018 12/30/17   Teryl Lucy, MD  oxyCODONE (ROXICODONE) 5 MG immediate release tablet Take 1 tablet (5 mg total) by mouth every 4 (four) hours as needed for severe pain. Patient not taking: Reported on 02/04/2018 12/30/17   Teryl Lucy, MD  rivaroxaban (XARELTO) 10 MG  TABS tablet Take 1 tablet (10 mg total) by mouth daily. Patient not taking: Reported on 02/04/2018 12/30/17   Teryl Lucy, MD  sennosides-docusate sodium (SENOKOT-S) 8.6-50 MG tablet Take 2 tablets by mouth daily. Patient not taking: Reported on 02/04/2018 12/30/17   Teryl Lucy, MD     Positive ROS: All other systems have been reviewed and were otherwise negative with the exception of those mentioned in the HPI and as above.  Physical Exam: General: Alert, no acute distress Cardiovascular: No pedal edema Respiratory: No cyanosis, no use of accessory musculature GI: No organomegaly, abdomen is soft and non-tender Skin: No lesions in the area of chief complaint Neurologic: Sensation intact distally Psychiatric: Patient is competent for consent with normal mood and affect Lymphatic: No axillary or cervical lymphadenopathy  MUSCULOSKELETAL: left knee varus with medial crepitance and pseudolaxity rom 0-115  Assessment: Left knee anteromedial osteoarthritis   Plan: Plan for Procedure(s): UNICOMPARTMENTAL LEFT KNEE  The risks benefits and alternatives were discussed with the patient including but not limited to the risks of nonoperative treatment, versus surgical intervention including infection, bleeding, nerve injury,  blood clots, cardiopulmonary complications, morbidity, mortality, among others, and they were willing to proceed.    Patient's anticipated LOS is less than 2 midnights, meeting these requirements: - Younger than 32 - Lives within 1 hour of care - Has a competent adult at home to recover with post-op recover - NO history of  - Chronic pain requiring opiods  - Diabetes  - Coronary Artery Disease  - Heart failure  - Heart attack  - Stroke  - DVT/VTE  - Cardiac arrhythmia  - Respiratory Failure/COPD  - Renal failure  - Anemia  - Advanced Liver disease       Preoperative templating of the joint replacement has been completed, documented, and submitted to  the Operating Room personnel in order to optimize intra-operative equipment management.  Eulas Post, MD Cell [phone]   02/24/2018 7:18 AM

## 2018-02-24 NOTE — Op Note (Signed)
02/24/2018  9:52 AM  PATIENT:  Nyan R Schill    PRE-OPERATIVE DIAGNOSIS: Left knee anteromedial osteoarthritis  POST-OPERATIVE DIAGNOSIS:  Same Lateral meniscal cyst overlying the anterior horn with a small loose body, measuring 1 cm x 1 cm  PROCEDURE: Left unicompartmental Knee Arthroplasty Open excision of anterior horn lateral meniscal cyst with removal of loose body, 1 cm x 1 cm  SURGEON:  Eulas PostJoshua P Loralee Weitzman, MD  PHYSICIAN ASSISTANT: Janace LittenBrandon Parry, OPA-C, present and scrubbed throughout the case, critical for completion in a timely fashion, and for retraction, instrumentation, and closure.  ANESTHESIA:   Spinal with regional block  ESTIMATED BLOOD LOSS: 100 mL  UNIQUE ASPECTS OF THE CASE: I had to cut the tibia twice.  It was fairly tight.  The intramedullary guide rod went down fairly smoothly by hand, although my angle felt a little bit aiming medially.  Initially, it appeared that the femur was going to impinge on the fence cut, however ultimately it tracked well with the polyethylene in place.  This was a correction of the varus.  The osteophyte within the notch was fairly sizable, and I removed this before making my fence cut.  Additionally, removal of the posterior lateral osteophyte off of the medial femoral condyle took a little bit of the condyle with it, but I do not think that it actually was even underneath the prosthesis, as the tracking of the prosthesis was in the center of the condyle.  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  Laterally, there was a cyst on the anterior horn of the lateral meniscus insertion which I excised, and there was also a small area at the capsular attachment that had a loose body on the lateral side.  I did make a small incision on the anterolateral capsular attachment, and excised the loose body, and then repaired this  with Vicryl suture.  PREOPERATIVE INDICATIONS:  Lupita DawnDamon R Dismore is a  42 y.o. male with a diagnosis of OA LEFT KNEE who failed conservative measures and elected for surgical management.    The risks benefits and alternatives were discussed with the patient preoperatively including but not limited to the risks of infection, bleeding, nerve injury, cardiopulmonary complications, blood clots, the need for revision surgery, among others, and the patient was willing to proceed.  OPERATIVE IMPLANTS: Biomet Oxford mobile bearing medial compartment arthroplasty femur size large, tibia size D, bearing size 3.  OPERATIVE FINDINGS: Endstage grade 4 medial compartment osteoarthritis. No significant changes in the lateral or patellofemoral joint.  The ACL was intact.  There was a loose body all over the anterior horn of the lateral meniscus noted at the capsular attachment, as well as a cyst on the front of the lateral attachment.  OPERATIVE PROCEDURE: The patient was brought to the operating room placed in the supine position.  Spinal anesthesia was administered. IV antibiotics were given. The lower extremity was placed in the legholder and prepped and draped in usual sterile fashion.  Time out was performed.  The leg was elevated and exsanguinated and the tourniquet was inflated. Anteromedial incision was performed, and I took care to preserve the MCL. Parapatellar incision was carried out, and the osteophytes were excised, along with the medial meniscus and a small portion of the fat pad.  I excised the cyst on the anterior horn of the lateral meniscus sharply.  I made a  small capsulotomy at the capsulomeniscal junction, and the loose body popped out.  I repaired this with a Vicryl suture.  The extra medullary tibial cutting jig was applied, using the spoon and the 4mm G-Clamp and the 2 mm shim, and I took care to protect the anterior cruciate ligament insertion and the tibial spine. The medial collateral  ligament was also protected, and I resected my proximal tibia, matching the anatomic slope.  I had removed the osteophyte within the notch prior to placement of the saw.  The proximal tibial bony cut was removed in one piece, and I turned my attention to the femur.  The intramedullary femoral rod was placed using the drill, and then using the appropriate reference, I assembled the femoral jig, setting my posterior cutting block. I resected my posterior femur, used the 0 spigot for the anterior femur, and then measured my gap.  I could not quite get the 3 in, so I went back and took another 2 mm off of the tibia.  I then used the appropriate mill to match the extension gap to the flexion gap. The second milling was at a 3.  The gaps were then measured again with the appropriate feeler gauges. Once I had balanced flexion and extension gaps, I then completed the preparation of the femur.  I milled off the anterior aspect of the distal femur to prevent impingement. I also exposed the tibia, and selected the above-named component, and then used the cutting jig to prepare the keel slot on the tibia. I also used the awl to curette out the bone to complete the preparation of the keel. The back wall was intact.  I then placed trial components, and it was found to have excellent motion, and appropriate balance.  I then cemented the components into place, cementing the tibia first, removing all excess cement, and then cementing the femur.  All loose cement was removed.  The real polyethylene insert was applied manually, and the knee was taken through functional range of motion, and found to have excellent stability and restoration of joint motion, with excellent balance.  The wounds were irrigated copiously, and the parapatellar tissue closed with Vicryl, followed by Vicryl for the subcutaneous tissue, with routine closure with Steri-Strips and sterile gauze.  The tourniquet was released, and the patient was  awakened and extubated and returned to PACU in stable and satisfactory condition. There were no complications.

## 2018-02-24 NOTE — Discharge Instructions (Signed)

## 2018-02-24 NOTE — Transfer of Care (Signed)
Immediate Anesthesia Transfer of Care Note  Patient: Tony Valdez  Procedure(s) Performed: UNICOMPARTMENTAL LEFT KNEE (Left Knee)  Patient Location: PACU  Anesthesia Type:MAC, Spinal and MAC combined with regional for post-op pain  Level of Consciousness: awake, alert , oriented and patient cooperative  Airway & Oxygen Therapy: Patient Spontanous Breathing and Patient connected to nasal cannula oxygen  Post-op Assessment: Report given to RN, Post -op Vital signs reviewed and stable and Patient moving all extremities X 4  Post vital signs: Reviewed and stable  Last Vitals:  Vitals Value Taken Time  BP 108/68 02/24/2018 10:02 AM  Temp    Pulse 70 02/24/2018 10:03 AM  Resp 19 02/24/2018 10:03 AM  SpO2 97 % 02/24/2018 10:03 AM  Vitals shown include unvalidated device data.  Last Pain:  Vitals:   02/24/18 0628  TempSrc:   PainSc: 0-No pain         Complications: No apparent anesthesia complications

## 2018-02-24 NOTE — Evaluation (Signed)
Physical Therapy Evaluation Patient Details Name: Tony Valdez R Bazinet MRN: [id number] DOB: 06/17/1976 Today's Date: 02/24/2018   History of Present Illness  42 y.o. male admitted on 02/24/18 for L unicompartmental knee replacement.  Pt with significant PMH of Tachycardia, obesity, HTN, R unicompartment knee (12/2017), and L ankle surgery.    Clinical Impression  Pt is POD #0 and is limited to OOB to chair and short distance gait in room with RW due to significant L knee pain.  He reports this pain is much more than the post op pain he had with his R uni knee in May.  Pt, despite pain, does not need much physical assist to get up and moving.  I anticipate he will be able to d/c home with family once pain is controlled.   PT to follow acutely for deficits listed below.     Follow Up Recommendations Follow surgeon's recommendation for DC plan and follow-up therapies;Supervision for mobility/OOB    Equipment Recommendations  None recommended by PT    Recommendations for Other Services   NA    Precautions / Restrictions Precautions Precautions: Knee Precaution Booklet Issued: Yes (comment) Precaution Comments: knee exercise handout given and precaution reviewed.  Required Braces or Orthoses: Knee Immobilizer - Left Restrictions Weight Bearing Restrictions: Yes LLE Weight Bearing: Weight bearing as tolerated      Mobility  Bed Mobility Overal bed mobility: Needs Assistance Bed Mobility: Supine to Sit     Supine to sit: Min guard;HOB elevated     General bed mobility comments: Min guard assist to help progress left leg to EOB.    Transfers Overall transfer level: Needs assistance Equipment used: Rolling walker (2 wheeled) Transfers: Sit to/from Stand Sit to Stand: Min guard;From elevated surface         General transfer comment: Min guard assist to steady pt for balance.  Bed elevated due to pt's tall stature.   Ambulation/Gait Ambulation/Gait assistance: Min guard Gait Distance  (Feet): 8 Feet Assistive device: Rolling walker (2 wheeled) Gait Pattern/deviations: Step-to pattern;Antalgic     General Gait Details: Pt with moderately antalgic gait pattern, limited to chair distance due to pain.              Balance Overall balance assessment: Needs assistance Sitting-balance support: Feet supported;No upper extremity supported Sitting balance-Leahy Scale: Good     Standing balance support: Bilateral upper extremity supported Standing balance-Leahy Scale: Poor                               Pertinent Vitals/Pain Pain Assessment: 0-10 Pain Score: 9  Pain Location: right knee Pain Descriptors / Indicators: Burning Pain Intervention(s): Limited activity within patient's tolerance;Monitored during session;Repositioned;Premedicated before session;Ice applied    Home Living Family/patient expects to be discharged to:: Private residence Living Arrangements: Parent Available Help at Discharge: Family Type of Home: House Home Access: Level entry     Home Layout: One level Home Equipment: Environmental consultantWalker - 2 wheels;Bedside commode      Prior Function Level of Independence: Independent               Hand Dominance   Dominant Hand: Right    Extremity/Trunk Assessment   Upper Extremity Assessment Upper Extremity Assessment: Overall WFL for tasks assessed    Lower Extremity Assessment Lower Extremity Assessment: LLE deficits/detail LLE Deficits / Details: left leg with normal post op pain and weakness.  Ankle at least 3/5, knee NT  due to pain and immobilization in KI, hip flexion 3-/5    Cervical / Trunk Assessment Cervical / Trunk Assessment: Normal  Communication   Communication: No difficulties  Cognition Arousal/Alertness: Awake/alert Behavior During Therapy: WFL for tasks assessed/performed Overall Cognitive Status: Within Functional Limits for tasks assessed                                            Exercises Total Joint Exercises Ankle Circles/Pumps: AROM;Both;20 reps   Assessment/Plan    PT Assessment Patient needs continued PT services  PT Problem List Decreased strength;Decreased range of motion;Decreased activity tolerance;Decreased balance;Decreased mobility;Decreased knowledge of use of DME;Decreased knowledge of precautions;Obesity;Pain       PT Treatment Interventions DME instruction;Gait training;Stair training;Functional mobility training;Therapeutic activities;Therapeutic exercise;Balance training;Patient/family education;Manual techniques;Modalities    PT Goals (Current goals can be found in the Care Plan section)  Acute Rehab PT Goals Patient Stated Goal: to go home tomorrow and decrease his pain PT Goal Formulation: With patient Time For Goal Achievement: 03/03/18 Potential to Achieve Goals: Good    Frequency 7X/week    AM-PAC PT "6 Clicks" Daily Activity  Outcome Measure Difficulty turning over in bed (including adjusting bedclothes, sheets and blankets)?: A Little Difficulty moving from lying on back to sitting on the side of the bed? : A Little Difficulty sitting down on and standing up from a chair with arms (e.g., wheelchair, bedside commode, etc,.)?: A Little Help needed moving to and from a bed to chair (including a wheelchair)?: A Little Help needed walking in hospital room?: A Little Help needed climbing 3-5 steps with a railing? : A Little 6 Click Score: 18    End of Session Equipment Utilized During Treatment: Left knee immobilizer Activity Tolerance: Patient limited by pain Patient left: in chair;with call bell/phone within reach Nurse Communication: Mobility status PT Visit Diagnosis: Muscle weakness (generalized) (M62.81);Difficulty in walking, not elsewhere classified (R26.2);Pain Pain - Right/Left: Left Pain - part of body: Knee    Time: 1610-9604 PT Time Calculation (min) (ACUTE ONLY): 19 min   Charges:     Lurena Joiner B.  Delton Stelle, PT, DPT [phone]        1 EV mod   02/24/2018, 10:06 PM

## 2018-02-24 NOTE — Anesthesia Procedure Notes (Signed)
Anesthesia Regional Block: Adductor canal block   Pre-Anesthetic Checklist: ,, timeout performed, Correct Patient, Correct Site, Correct Laterality, Correct Procedure, Correct Position, site marked, Risks and benefits discussed,  Surgical consent,  Pre-op evaluation,  At surgeon's request and post-op pain management  Laterality: Left  Prep: chloraprep       Needles:  Injection technique: Single-shot  Needle Type: Echogenic Stimulator Needle     Needle Length: 5cm  Needle Gauge: 22     Additional Needles:   Procedures:, nerve stimulator,,, ultrasound used (permanent image in chart),,,,  Narrative:  Start time: 02/24/2018 6:55 AM End time: 02/24/2018 7:00 AM Injection made incrementally with aspirations every 5 mL.  Performed by: Personally  Anesthesiologist: Bethena Midgetddono, Layton Tappan, MD  Additional Notes: Functioning IV was confirmed and monitors were applied.  A 50mm 22ga Arrow echogenic stimulator needle was used. Sterile prep and drape,hand hygiene and sterile gloves were used. Ultrasound guidance: relevant anatomy identified, needle position confirmed, local anesthetic spread visualized around nerve(s)., vascular puncture avoided.  Image printed for medical record. Negative aspiration and negative test dose prior to incremental administration of local anesthetic. The patient tolerated the procedure well.

## 2018-02-24 NOTE — Anesthesia Procedure Notes (Signed)
Procedure Name: MAC Date/Time: 02/24/2018 7:42 AM Performed by: Carney Living, CRNA Pre-anesthesia Checklist: Patient identified, Emergency Drugs available, Suction available, Patient being monitored and Timeout performed Patient Re-evaluated:Patient Re-evaluated prior to induction Oxygen Delivery Method: Nasal cannula

## 2018-02-24 NOTE — Anesthesia Procedure Notes (Signed)
Procedure Name: MAC Date/Time: 02/24/2018 8:21 AM Performed by: Carney Living, CRNA Oxygen Delivery Method: Simple face mask

## 2018-02-24 NOTE — Plan of Care (Signed)
  Problem: Activity: Goal: Risk for activity intolerance will decrease Outcome: Progressing   

## 2018-02-24 NOTE — Anesthesia Procedure Notes (Signed)
Spinal  Patient location during procedure: OR Start time: 02/24/2018 7:35 AM End time: 02/24/2018 7:45 AM Staffing Anesthesiologist: Bethena Midgetddono, Jamielyn Petrucci, MD Preanesthetic Checklist Completed: patient identified, site marked, surgical consent, pre-op evaluation, timeout performed, IV checked, risks and benefits discussed and monitors and equipment checked Spinal Block Patient position: sitting Prep: DuraPrep Patient monitoring: heart rate, cardiac monitor, continuous pulse ox and blood pressure Approach: midline Location: L3-4 Injection technique: single-shot Needle Needle type: Sprotte  Needle gauge: 25 G Needle length: 12.7 cm Assessment Sensory level: T4 Additional Notes Attempt X 2 at L4/5 and L3/4 with 24x10cm  Change to 25x12.7 first pass + CSF/DOSE/+CSF

## 2018-02-25 ENCOUNTER — Encounter (HOSPITAL_COMMUNITY): Payer: Self-pay | Admitting: Orthopedic Surgery

## 2018-02-25 DIAGNOSIS — M1712 Unilateral primary osteoarthritis, left knee: Secondary | ICD-10-CM | POA: Diagnosis not present

## 2018-02-25 LAB — BASIC METABOLIC PANEL
ANION GAP: 4 — AB (ref 5–15)
BUN: 15 mg/dL (ref 6–20)
CHLORIDE: 104 mmol/L (ref 98–111)
CO2: 29 mmol/L (ref 22–32)
Calcium: 8.4 mg/dL — ABNORMAL LOW (ref 8.9–10.3)
Creatinine, Ser: 0.93 mg/dL (ref 0.61–1.24)
GFR calc non Af Amer: >60 mL/min (ref >60)
Glucose, Bld: 108 mg/dL — ABNORMAL HIGH (ref 70–99)
POTASSIUM: 4.2 mmol/L (ref 3.5–5.1)
Sodium: 137 mmol/L (ref 135–145)

## 2018-02-25 LAB — CBC
HEMATOCRIT: 40.7 % (ref 39.0–52.0)
HEMOGLOBIN: 13.2 g/dL (ref 13.0–17.0)
MCH: 29.1 pg (ref 26.0–34.0)
MCHC: 32.4 g/dL (ref 30.0–36.0)
MCV: 89.8 fL (ref 78.0–100.0)
Platelets: 272 10*3/uL (ref 150–400)
RBC: 4.53 MIL/uL (ref 4.22–5.81)
RDW: 13.3 % (ref 11.5–15.5)
WBC: 8.2 10*3/uL (ref 4.0–10.5)

## 2018-02-25 NOTE — Discharge Summary (Signed)
Physician Discharge Summary  Patient ID: Tony Valdez MRN: [id number] DOB/AGE: May 19, 1976 42 y.o.  Admit date: 02/24/2018 Discharge date: 02/25/2018  Admission Diagnoses:  Primary localized osteoarthritis of left knee  Discharge Diagnoses:  Principal Problem:   Primary localized osteoarthritis of left knee Active Problems:   Severe obesity (BMI >= 40) (HCC)   S/P knee replacement   Past Medical History:  Diagnosis Date  . Dysrhythmia    SVT  . Hypertension   . Primary localized osteoarthritis of left knee 02/24/2018  . Primary localized osteoarthritis of right knee 12/30/2017  . Severe obesity (BMI >= 40) (HCC) 12/30/2017  . Tachycardia     Surgeries: Procedure(s): UNICOMPARTMENTAL LEFT KNEE on 02/24/2018   Consultants (if any):   Discharged Condition: Improved  Hospital Course: Tony Valdez is an 42 y.o. male who was admitted 02/24/2018 with a diagnosis of Primary localized osteoarthritis of left knee and went to the operating room on 02/24/2018 and underwent the above named procedures.    He was given perioperative antibiotics:  Anti-infectives (From admission, onward)   Start     Dose/Rate Route Frequency Ordered Stop   02/24/18 1500  ceFAZolin (ANCEF) IVPB 2g/100 mL premix     2 g 200 mL/hr over 30 Minutes Intravenous Every 6 hours 02/24/18 1239 02/24/18 2046   02/24/18 0630  ceFAZolin (ANCEF) 3 g in dextrose 5 % 50 mL IVPB     3 g 100 mL/hr over 30 Minutes Intravenous To ShortStay Surgical 02/23/18 1429 02/24/18 0800    .  He was given sequential compression devices, early ambulation, and aspirin for DVT prophylaxis.  He benefited maximally from the hospital stay and there were no complications.    Recent vital signs:  Vitals:   02/24/18 2342 02/25/18 0407  BP: 115/80 (!) 141/83  Pulse: 73 65  Resp: 16   Temp: 97.6 F (36.4 C) 98.2 F (36.8 C)  SpO2: 98% 98%    Recent laboratory studies:  Lab Results  Component Value Date   HGB 13.2 02/25/2018   HGB 15.4  02/10/2018   HGB 14.0 12/31/2017   Lab Results  Component Value Date   WBC 8.2 02/25/2018   PLT 272 02/25/2018   No results found for: INR Lab Results  Component Value Date   NA 137 02/25/2018   K 4.2 02/25/2018   CL 104 02/25/2018   CO2 29 02/25/2018   BUN 15 02/25/2018   CREATININE 0.93 02/25/2018   GLUCOSE 108 (H) 02/25/2018    Discharge Medications:   Allergies as of 02/25/2018      Reactions   Codeine Nausea And Vomiting, Other (See Comments)   GI UPSET      Medication List    STOP taking these medications   ondansetron 4 MG tablet Commonly known as:  ZOFRAN   rivaroxaban 10 MG Tabs tablet Commonly known as:  XARELTO     TAKE these medications   aspirin EC 325 MG tablet Take 1 tablet (325 mg total) by mouth daily.   baclofen 10 MG tablet Commonly known as:  LIORESAL Take 1 tablet (10 mg total) by mouth 3 (three) times daily as needed for muscle spasms.   losartan 100 MG tablet Commonly known as:  COZAAR Take 100 mg by mouth daily.   metoprolol succinate 50 MG 24 hr tablet Commonly known as:  TOPROL-XL Take 1 tablet (50 mg total) by mouth daily.   nicotine 14 mg/24hr patch Commonly known as:  NICODERM CQ - dosed  in mg/24 hours Place 14 mg onto the skin daily.   oxyCODONE 5 MG immediate release tablet Commonly known as:  ROXICODONE Take 1-2 tablets (5-10 mg total) by mouth every 6 (six) hours as needed for severe pain. What changed:    how much to take  when to take this   sennosides-docusate sodium 8.6-50 MG tablet Commonly known as:  SENOKOT-S Take 2 tablets by mouth daily.   WELLBUTRIN SR 150 MG 12 hr tablet Generic drug:  buPROPion Take 150 mg by mouth 2 (two) times daily.       Diagnostic Studies: Dg Knee Left Port  Result Date: 02/24/2018 CLINICAL DATA:  Status post partial knee replacement EXAM: PORTABLE LEFT KNEE - 2 VIEW COMPARISON:  None. FINDINGS: Partial knee replacement is noted in the medial joint space. No acute bony or  soft tissue abnormality is seen. IMPRESSION: Status post partial knee replacement Electronically Signed   By: Alcide CleverMark  Lukens M.D.   On: 02/24/2018 10:39    Disposition:     Follow-up Information    Teryl LucyLandau, Heatherly Stenner, MD. Schedule an appointment as soon as possible for a visit in 2 weeks.   Specialty:  Orthopedic Surgery Contact information: [address] [place] [address] [phone]            Signed: Eulas PostJoshua P Vernon Ariel 02/25/2018, 8:16 AM

## 2018-02-25 NOTE — Plan of Care (Signed)
  Problem: Education: Goal: Knowledge of General Education information will improve Outcome: Progressing   

## 2018-02-25 NOTE — Care Management (Signed)
Patient was discharged prior to Case manager speaking with him. CM contacted patient via phone to discuss discharge plan. Patient states he will begin outpatient therapy early nexk wee, he has DME. No further CM needs.

## 2018-02-25 NOTE — Plan of Care (Signed)
  Problem: Education: Goal: Knowledge of General Education information will improve 02/25/2018 1203 by Darreld Mcleanox, Candido Flott, RN Outcome: Adequate for Discharge 02/25/2018 1116 by Darreld Mcleanox, Yariana Hoaglund, RN Outcome: Progressing

## 2018-02-25 NOTE — Anesthesia Postprocedure Evaluation (Signed)
Anesthesia Post Note  Patient: Robie R Papandrea  Procedure(s) Performed: UNICOMPARTMENTAL LEFT KNEE (Left Knee)     Patient location during evaluation: PACU Anesthesia Type: Spinal Level of consciousness: sedated Pain management: pain level controlled Vital Signs Assessment: post-procedure vital signs reviewed and stable Respiratory status: patient remains intubated per anesthesia plan Cardiovascular status: stable Postop Assessment: no apparent nausea or vomiting Anesthetic complications: no    Last Vitals:  Vitals:   02/25/18 0407 02/25/18 0828  BP: (!) 141/83 136/80  Pulse: 65 72  Resp:  18  Temp: 36.8 C 37.1 C  SpO2: 98% 93%    Last Pain:  Vitals:   02/25/18 0828  TempSrc: Oral  PainSc:                  Korrina Zern

## 2018-02-25 NOTE — Progress Notes (Signed)
Physical Therapy Treatment Patient Details Name: Tony Valdez MRN: [id number] DOB: 09/19/1975 Today's Date: 02/25/2018    History of Present Illness 42 y.o. male admitted on 02/24/18 for L unicompartmental knee replacement.  Pt with significant PMH of Tachycardia, obesity, HTN, R unicompartment knee (12/2017), and L ankle surgery.      PT Comments    Pt is POD #1 and pain is significantly better this AM.  He is able to walk the unit with RW and preform his entire HEP program.  He is safe from a mobility standpoint to go home with his parents.   PT to follow acutely until d/c confirmed.     Follow Up Recommendations  Follow surgeon's recommendation for DC plan and follow-up therapies;Supervision for mobility/OOB     Equipment Recommendations  None recommended by PT    Recommendations for Other Services   NA     Precautions / Restrictions Precautions Precautions: Knee Precaution Booklet Issued: Yes (comment) Precaution Comments: knee exercise handout given and precaution reviewed.  Required Braces or Orthoses: Knee Immobilizer - Left Restrictions Weight Bearing Restrictions: Yes LLE Weight Bearing: Weight bearing as tolerated    Mobility  Bed Mobility Overal bed mobility: Modified Independent                Transfers Overall transfer level: Needs assistance Equipment used: Rolling walker (2 wheeled) Transfers: Sit to/from Stand Sit to Stand: Supervision;From elevated surface         General transfer comment: supervision for safety, bed elevated due to pt's tall stature  Ambulation/Gait Ambulation/Gait assistance: Supervision Gait Distance (Feet): 200 Feet Assistive device: Rolling walker (2 wheeled) Gait Pattern/deviations: Step-through pattern;Antalgic     General Gait Details: Pt with moderately antalgic gait pattern, attempting heel to toe, but difficult with KI donned.  Pt will likely need the brace 1-2 more days due to weak SLR.    Stairs Stairs: (he  has no STE his parents' home)                  Balance Overall balance assessment: Needs assistance Sitting-balance support: Feet supported;Bilateral upper extremity supported Sitting balance-Leahy Scale: Good     Standing balance support: Single extremity supported;Bilateral upper extremity supported;No upper extremity supported Standing balance-Leahy Scale: Fair                              Cognition Arousal/Alertness: Awake/alert Behavior During Therapy: WFL for tasks assessed/performed Overall Cognitive Status: Within Functional Limits for tasks assessed                                        Exercises Total Joint Exercises Ankle Circles/Pumps: AROM;Both;20 reps Quad Sets: AROM;Left;10 reps Towel Squeeze: AROM;Both;10 reps Short Arc Quad: AROM;Left;10 reps Heel Slides: AAROM;Left;10 reps Hip ABduction/ADduction: AROM;Left;10 reps Straight Leg Raises: AROM;Left;10 reps Long Arc Quad: AROM;Left;10 reps Knee Flexion: AROM;Left;10 reps Goniometric ROM: 8-100        Pertinent Vitals/Pain Pain Assessment: 0-10 Pain Score: 9  Pain Location: right knee Pain Descriptors / Indicators: Burning Pain Intervention(s): Limited activity within patient's tolerance;Monitored during session;Repositioned           PT Goals (current goals can now be found in the care plan section) Acute Rehab PT Goals Patient Stated Goal: to go home tomorrow and decrease his pain Progress towards PT goals: Progressing toward  goals    Frequency    7X/week      PT Plan Current plan remains appropriate       AM-PAC PT "6 Clicks" Daily Activity  Outcome Measure  Difficulty turning over in bed (including adjusting bedclothes, sheets and blankets)?: None Difficulty moving from lying on back to sitting on the side of the bed? : None Difficulty sitting down on and standing up from a chair with arms (e.g., wheelchair, bedside commode, etc,.)?: None Help  needed moving to and from a bed to chair (including a wheelchair)?: None Help needed walking in hospital room?: None Help needed climbing 3-5 steps with a railing? : A Little 6 Click Score: 23    End of Session Equipment Utilized During Treatment: Left knee immobilizer Activity Tolerance: Patient limited by pain Patient left: in bed;with call bell/phone within reach;with family/visitor present Nurse Communication: Mobility status PT Visit Diagnosis: Muscle weakness (generalized) (M62.81);Difficulty in walking, not elsewhere classified (R26.2);Pain Pain - Right/Left: Left Pain - part of body: Knee     Time: 1005-1057 PT Time Calculation (min) (ACUTE ONLY): 52 min  Charges:  $Gait Training: 8-22 mins $Therapeutic Exercise: 23-37 mins          [name], PT, DPT [phone]            02/25/2018, 11:45 AM

## 2019-01-22 ENCOUNTER — Telehealth: Payer: Self-pay | Admitting: Cardiovascular Disease

## 2019-01-22 NOTE — Telephone Encounter (Signed)
LVM for patient to call and schedule virtual visit with Dr. Kirke Corin.

## 2019-03-01 ENCOUNTER — Telehealth: Payer: Self-pay | Admitting: *Deleted

## 2019-03-01 NOTE — Telephone Encounter (Signed)
Virtual Visit Pre-Appointment Phone Call  "(Name), I am calling you today to discuss your upcoming appointment. We are currently trying to limit exposure to the virus that causes COVID-19 by seeing patients at home rather than in the office."  1. "What is the BEST phone number to call the day of the visit?" - include this in appointment notes  2. "Do you have or have access to (through a family member/friend) a smartphone with video capability that we can use for your visit?" a. If yes - list this number in appt notes as "cell" (if different from BEST phone #) and list the appointment type as a VIDEO visit in appointment notes b. If no - list the appointment type as a PHONE visit in appointment notes  3. Confirm consent - "In the setting of the current Covid19 crisis, you are scheduled for a (phone or video) visit with your provider on (date) at (time).  Just as we do with many in-office visits, in order for you to participate in this visit, we must obtain consent.  If you'd like, I can send this to your mychart (if signed up) or email for you to review.  Otherwise, I can obtain your verbal consent now.  All virtual visits are billed to your insurance company just like a normal visit would be.  By agreeing to a virtual visit, we'd like you to understand that the technology does not allow for your provider to perform an examination, and thus may limit your provider's ability to fully assess your condition. If your provider identifies any concerns that need to be evaluated in person, we will make arrangements to do so.  Finally, though the technology is pretty good, we cannot assure that it will always work on either your or our end, and in the setting of a video visit, we may have to convert it to a phone-only visit.  In either situation, we cannot ensure that we have a secure connection.  Are you willing to proceed?" Yes  4. Advise patient to be prepared - "Two hours prior to your appointment, go  ahead and check your blood pressure, pulse, oxygen saturation, and your weight (if you have the equipment to check those) and write them all down. When your visit starts, your provider will ask you for this information. If you have an Apple Watch or Kardia device, please plan to have heart rate information ready on the day of your appointment. Please have a pen and paper handy nearby the day of the visit as well."  5. Give patient instructions for MyChart download to smartphone OR [email or web address] as below if video visit (depending on what platform provider is using)  6. Inform patient they will receive a phone call 15 minutes prior to their appointment time (may be from unknown caller ID) so they should be prepared to answer    TELEPHONE CALL NOTE  Tony Valdez has been deemed a candidate for a follow-up tele-health visit to limit community exposure during the Covid-19 pandemic. I spoke with the patient via phone to ensure availability of phone/video source, confirm preferred email & phone number, and discuss instructions and expectations.  I reminded Tony Valdez to be prepared with any vital sign and/or heart rhythm information that could potentially be obtained via home monitoring, at the time of his visit. I reminded Tony Valdez to expect a phone call prior to his visit.  Ricci Barker, RN 03/01/2019 10:01 AM   INSTRUCTIONS  FOR DOWNLOADING THE MYCHART APP TO SMARTPHONE  - The patient must first make sure to have activated MyChart and know their login information - If Apple, go to [employer] and type in MyChart in the search bar and download the app. If Android, ask patient to go to [employer] and type in [place] in the search bar and download the app. The app is free but as with any other app downloads, their phone may require them to verify saved payment information or Apple/Android password.  - The patient will need to then log into the app with their MyChart username and  password, and select [facility] as their healthcare provider to link the account. When it is time for your visit, go to the MyChart app, find appointments, and click Begin Video Visit. Be sure to Select Allow for your device to access the Microphone and Camera for your visit. You will then be connected, and your provider will be with you shortly.  **If they have any issues connecting, or need assistance please contact MyChart service desk [phone])**  **If using a computer, in order to ensure the best quality for their visit they will need to use either of the following Internet Browsers: [employer], or Google Chrome**  IF USING DOXIMITY or DOXY.ME - The patient will receive a link just prior to their visit by text.     FULL LENGTH CONSENT FOR TELE-HEALTH VISIT   I hereby voluntarily request, consent and authorize [facility] and its employed or contracted physicians, physician assistants, nurse practitioners or other licensed health care professionals (the Practitioner), to provide me with telemedicine health care services (the "Services") as deemed necessary by the treating Practitioner. I acknowledge and consent to receive the Services by the Practitioner via telemedicine. I understand that the telemedicine visit will involve communicating with the Practitioner through live audiovisual communication technology and the disclosure of certain medical information by electronic transmission. I acknowledge that I have been given the opportunity to request an in-person assessment or other available alternative prior to the telemedicine visit and am voluntarily participating in the telemedicine visit.  I understand that I have the right to withhold or withdraw my consent to the use of telemedicine in the course of my care at any time, without affecting my right to future care or treatment, and that the Practitioner or I may terminate the telemedicine visit at any time. I  understand that I have the right to inspect all information obtained and/or recorded in the course of the telemedicine visit and may receive copies of available information for a reasonable fee.  I understand that some of the potential risks of receiving the Services via telemedicine include:  Marland Kitchen Delay or interruption in medical evaluation due to technological equipment failure or disruption; . Information transmitted may not be sufficient (e.g. poor resolution of images) to allow for appropriate medical decision making by the Practitioner; and/or  . In rare instances, security protocols could fail, causing a breach of personal health information.  Furthermore, I acknowledge that it is my responsibility to provide information about my medical history, conditions and care that is complete and accurate to the best of my ability. I acknowledge that Practitioner's advice, recommendations, and/or decision may be based on factors not within their control, such as incomplete or inaccurate data provided by me or distortions of diagnostic images or specimens that may result from electronic transmissions. I understand that the practice of medicine is not an exact science and  that Practitioner makes no warranties or guarantees regarding treatment outcomes. I acknowledge that I will receive a copy of this consent concurrently upon execution via email to the email address I last provided but may also request a printed copy by calling the office of [facility].    I understand that my insurance will be billed for this visit.   I have read or had this consent read to me. . I understand the contents of this consent, which adequately explains the benefits and risks of the Services being provided via telemedicine.  . I have been provided ample opportunity to ask questions regarding this consent and the Services and have had my questions answered to my satisfaction. . I give my informed consent for the services to be  provided through the use of telemedicine in my medical care  By participating in this telemedicine visit I agree to the above.

## 2019-03-02 ENCOUNTER — Telehealth: Payer: BLUE CROSS/BLUE SHIELD | Admitting: Cardiovascular Disease

## 2019-05-29 IMAGING — CR DG KNEE 1-2V PORT*R*
2 series · 2 of 2 positions shown · non-contrast
Comparison: None.

CLINICAL DATA: Status post partial knee replacement

EXAM:
PORTABLE RIGHT KNEE - 1-2 VIEW

[AP]
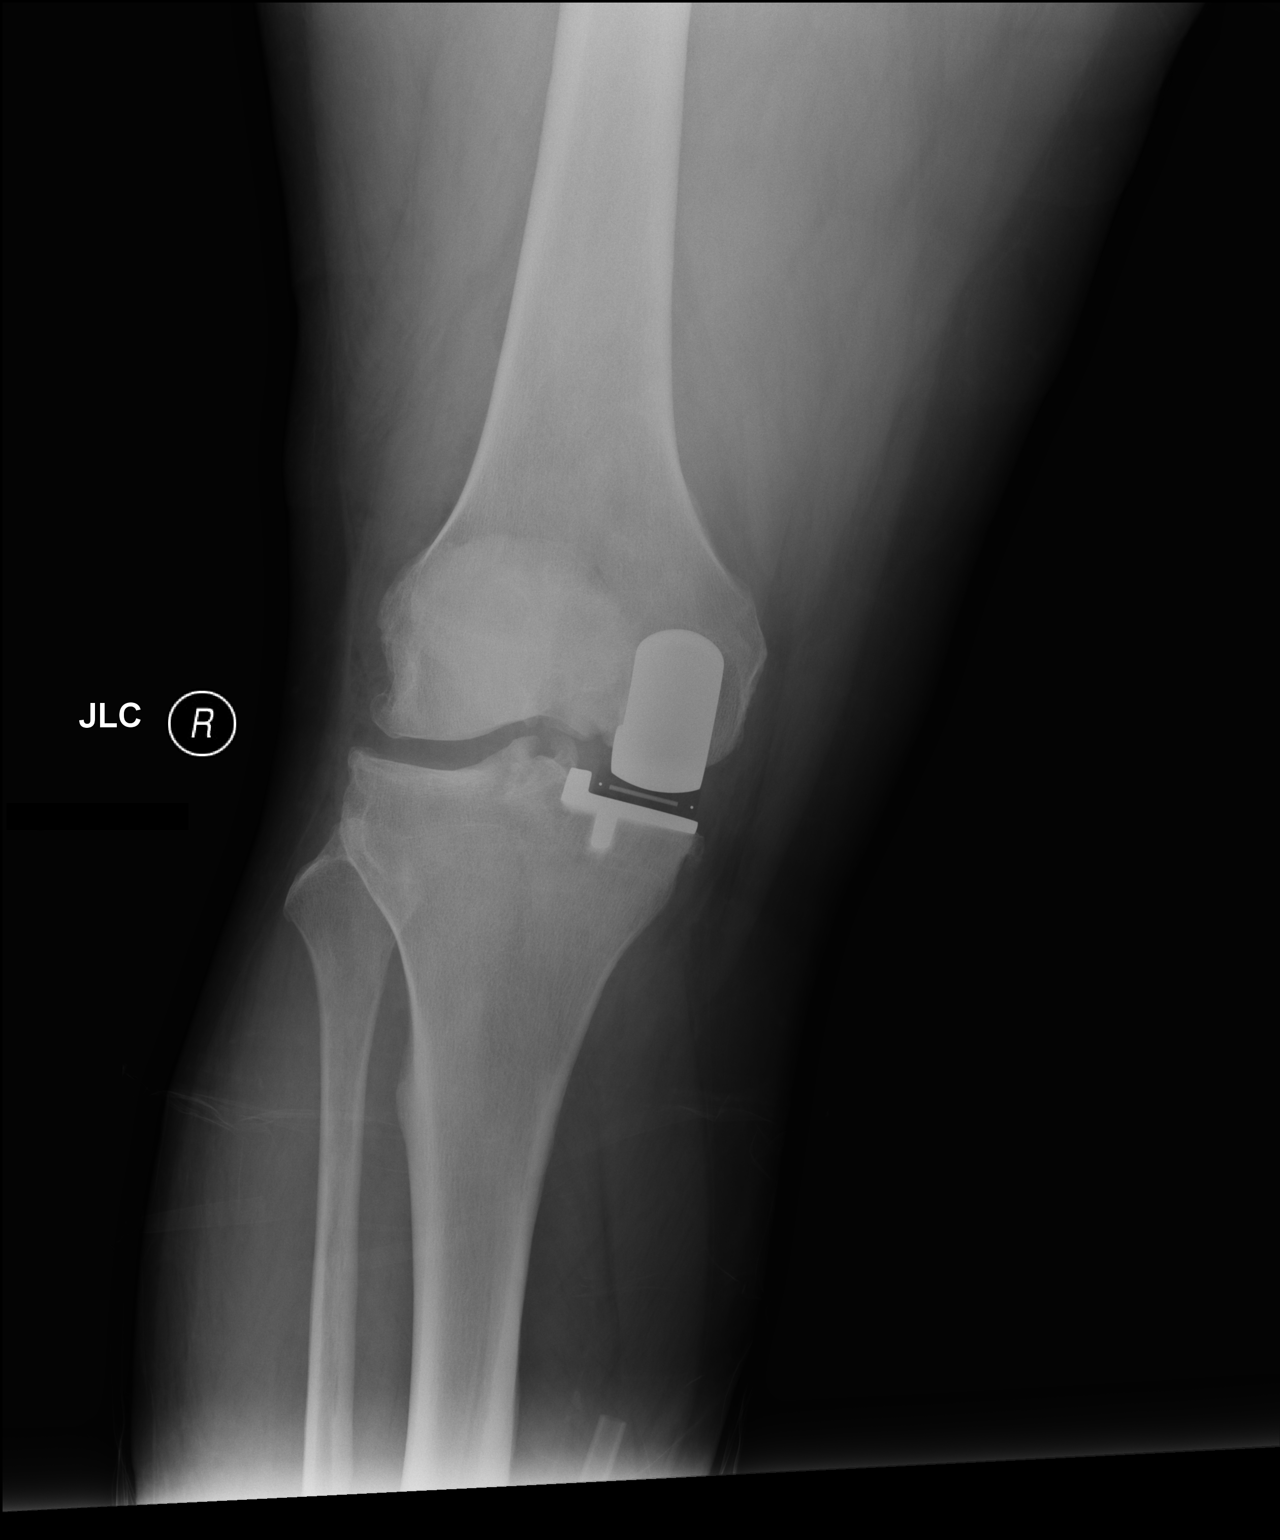

[xtable lateral]
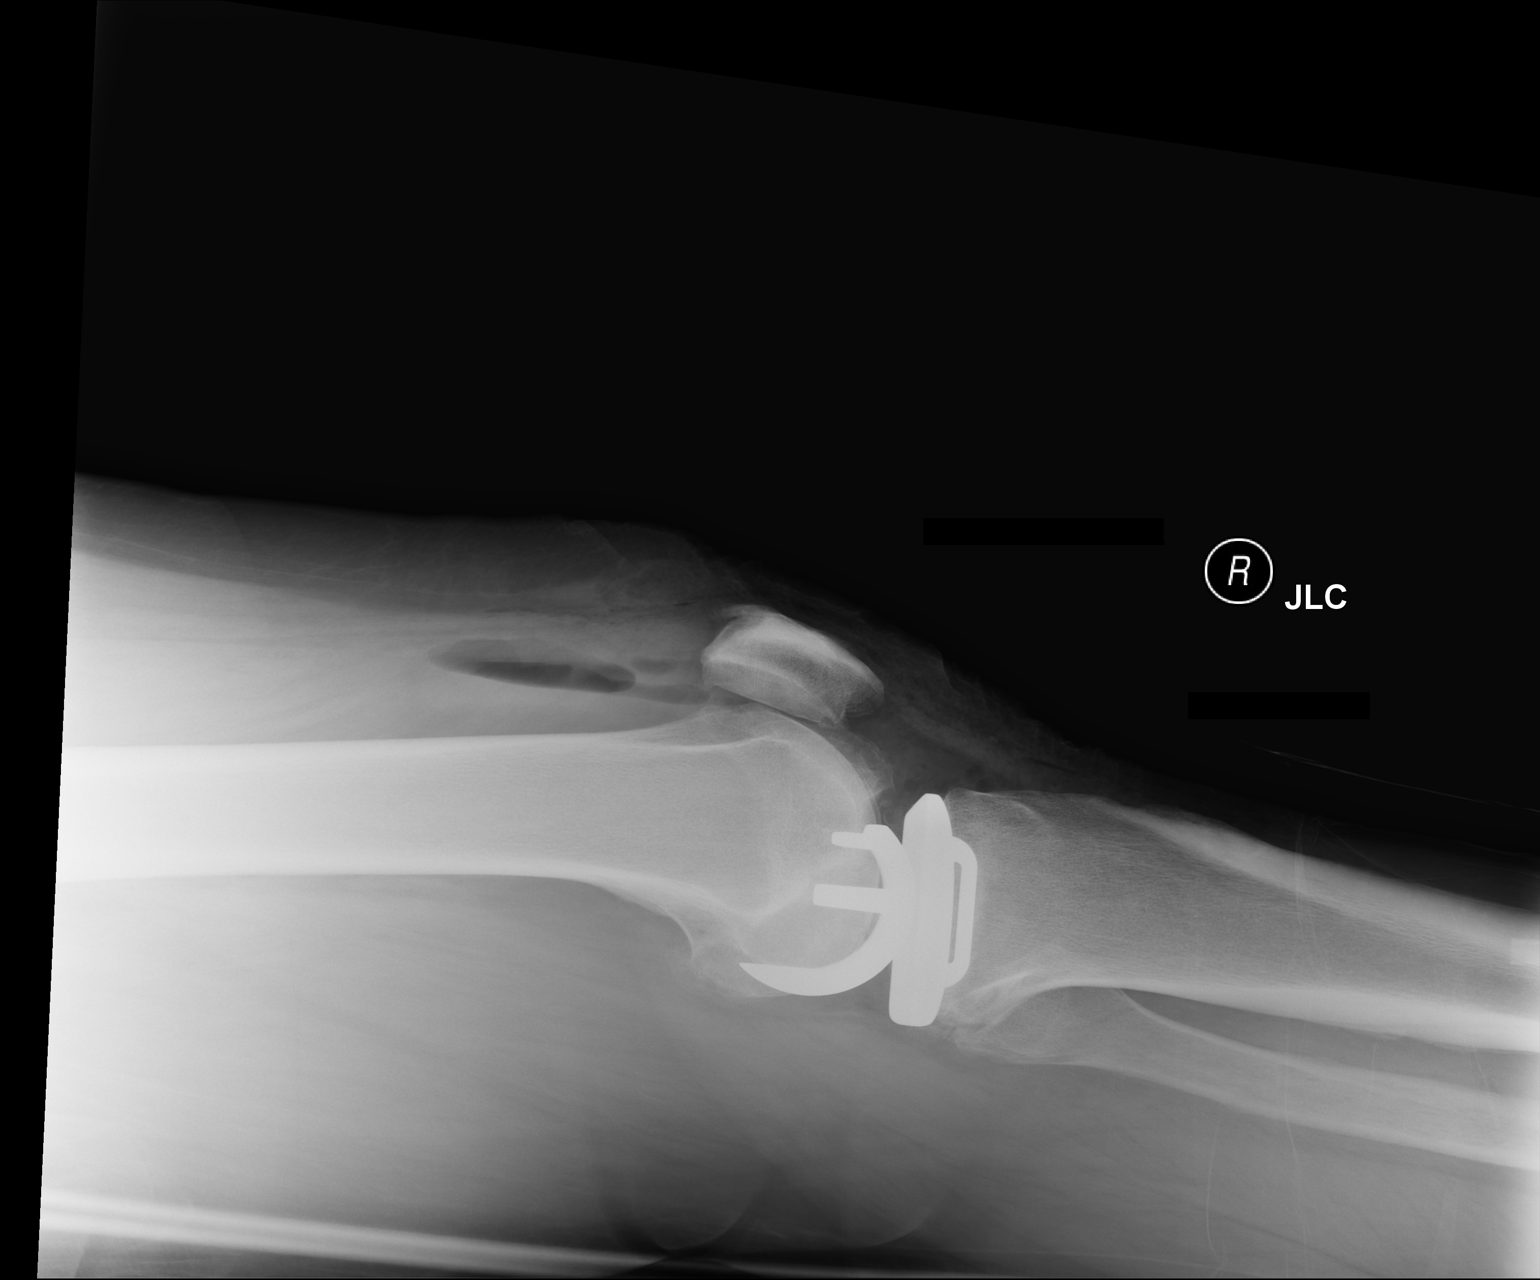

[2 of 2 positions shown; findings below may reference images not displayed]

FINDINGS: Partial knee replacement is noted medially. Mild degenerative
changes are noted in the patellofemoral space as well as the lateral
joint space. No acute bony or soft tissue abnormality is seen.
IMPRESSION: Medial partial knee replacement.

## 2019-07-24 IMAGING — DX DG KNEE 1-2V PORT*L*
2 series · 2 of 2 positions shown · non-contrast
Comparison: None.

CLINICAL DATA: Status post partial knee replacement

EXAM:
PORTABLE LEFT KNEE - 2 VIEW

[knee ap]
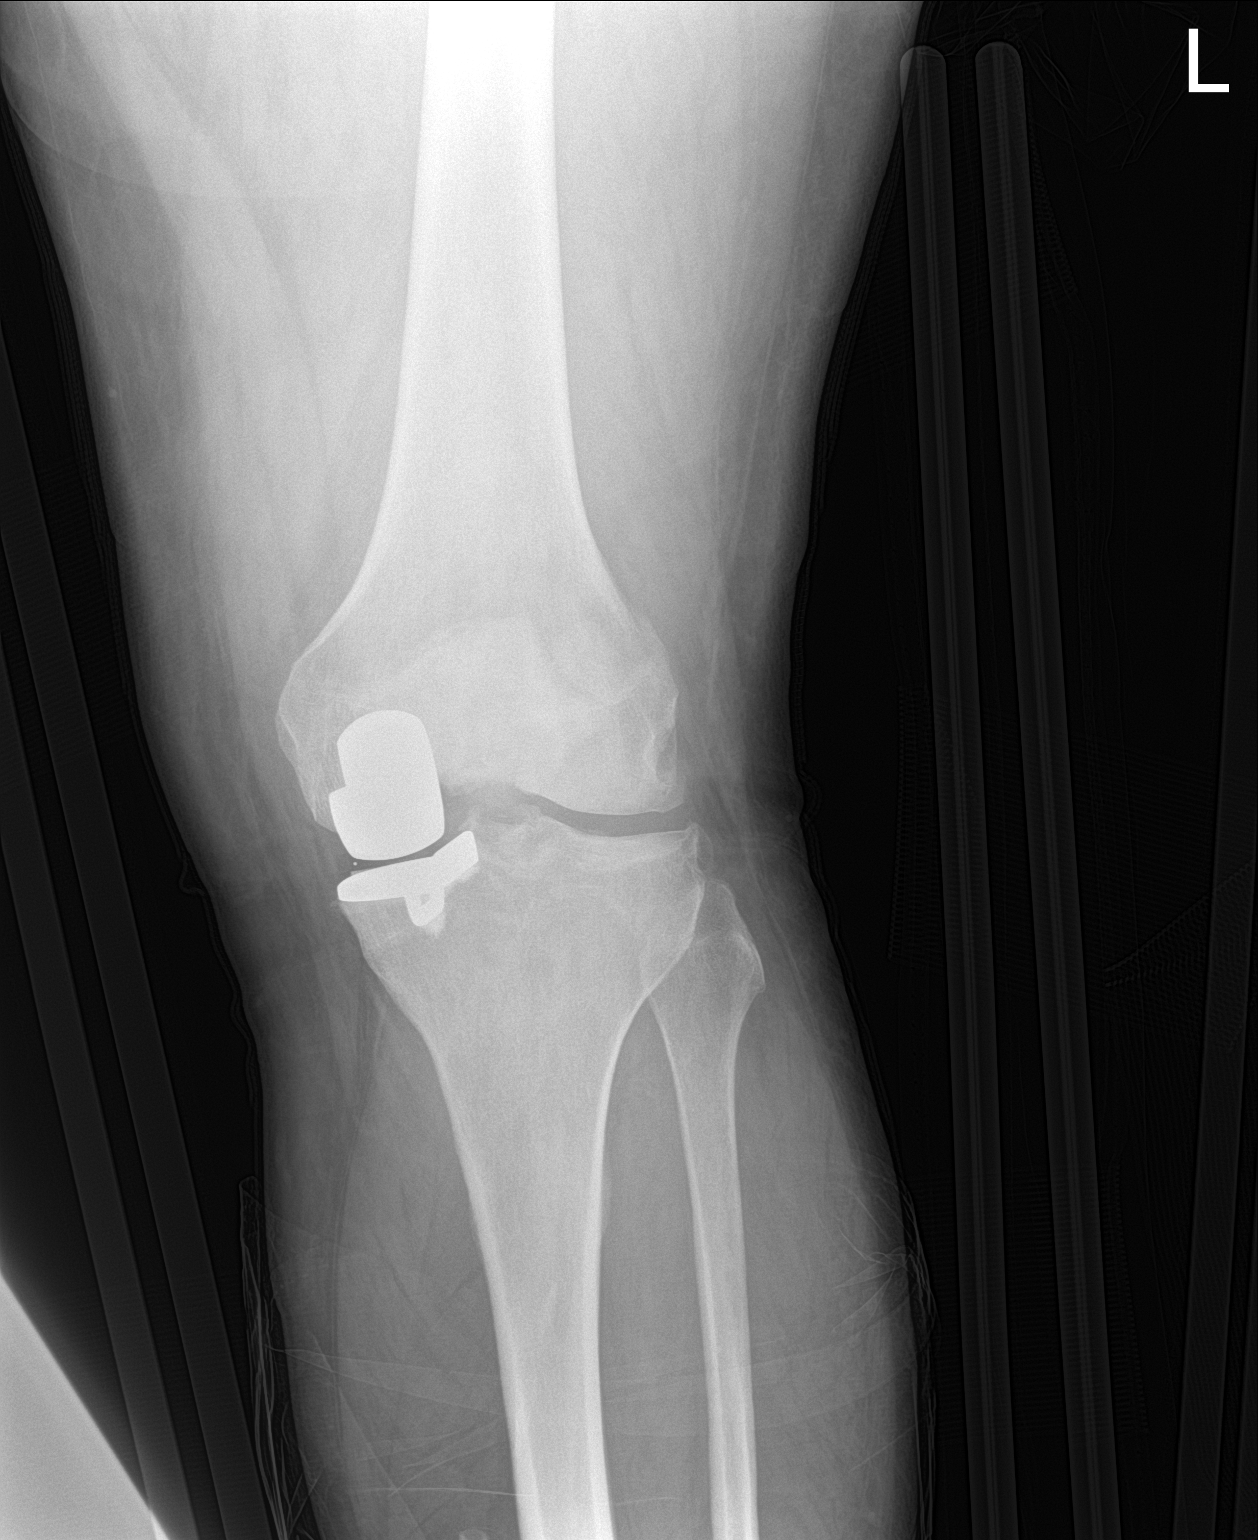

[knee lat]
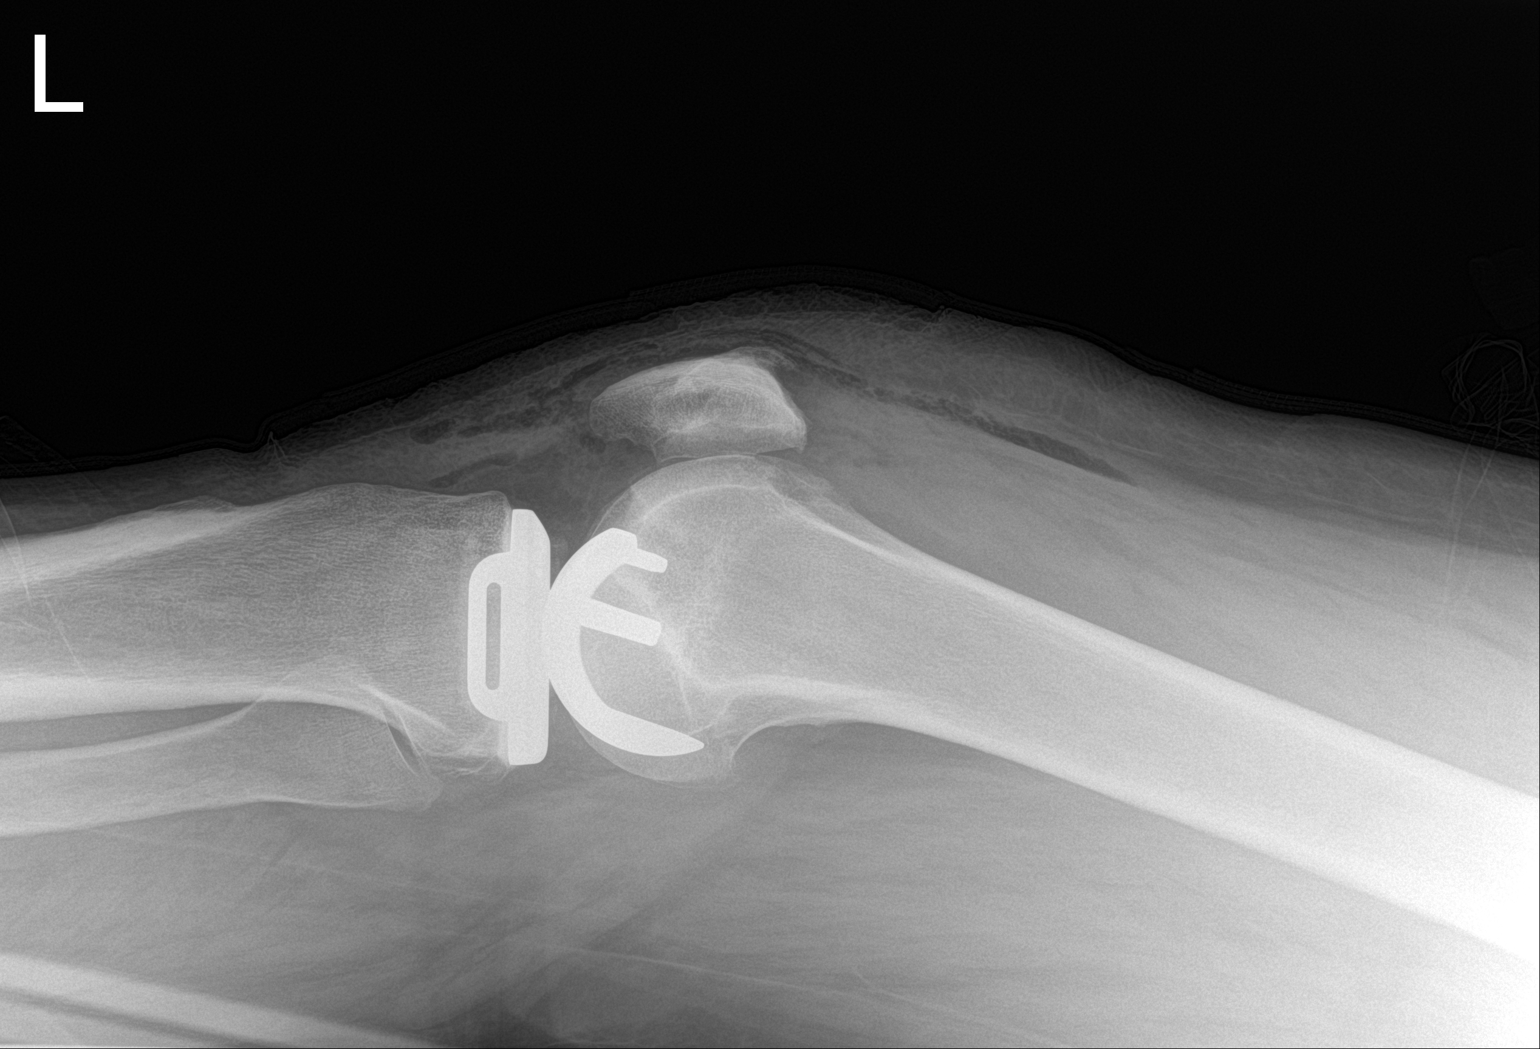

[2 of 2 positions shown; findings below may reference images not displayed]

FINDINGS: Partial knee replacement is noted in the medial joint space. No
acute bony or soft tissue abnormality is seen.
IMPRESSION: Status post partial knee replacement
